# Patient Record
Sex: Female | Born: 2000 | Race: White | Hispanic: No | Marital: Single | Smoking: Never smoker
Health system: Southern US, Community
[De-identification: ages and names within clinical notes are randomized; demographics above are authoritative.]

## PROBLEM LIST (undated history)

## (undated) DIAGNOSIS — IMO0002 Reserved for concepts with insufficient information to code with codable children: Secondary | ICD-10-CM

## (undated) DIAGNOSIS — I10 Essential (primary) hypertension: Secondary | ICD-10-CM

## (undated) DIAGNOSIS — F329 Major depressive disorder, single episode, unspecified: Secondary | ICD-10-CM

## (undated) DIAGNOSIS — R41840 Attention and concentration deficit: Secondary | ICD-10-CM

## (undated) DIAGNOSIS — F32A Depression, unspecified: Secondary | ICD-10-CM

## (undated) DIAGNOSIS — F938 Other childhood emotional disorders: Secondary | ICD-10-CM

## (undated) DIAGNOSIS — T8859XA Other complications of anesthesia, initial encounter: Secondary | ICD-10-CM

## (undated) HISTORY — DX: Essential (primary) hypertension: I10

## (undated) HISTORY — DX: Other complications of anesthesia, initial encounter: T88.59XA

---
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
## 2021-01-18 ENCOUNTER — Emergency Department (HOSPITAL_COMMUNITY)
Admission: EM | Admit: 2021-01-18 | Discharge: 2021-01-18 | Disposition: A | Discharge status: Home / Self Care | Payer: Medicaid Other | Attending: Emergency Medicine | Admitting: Emergency Medicine

## 2021-01-18 ENCOUNTER — Encounter (HOSPITAL_COMMUNITY): Payer: Self-pay | Admitting: Emergency Medicine

## 2021-01-18 ENCOUNTER — Emergency Department (HOSPITAL_COMMUNITY): Payer: Medicaid Other

## 2021-01-18 ENCOUNTER — Other Ambulatory Visit: Payer: Self-pay

## 2021-01-18 DIAGNOSIS — Z7722 Contact with and (suspected) exposure to environmental tobacco smoke (acute) (chronic): Secondary | ICD-10-CM | POA: Diagnosis not present

## 2021-01-18 DIAGNOSIS — J45909 Unspecified asthma, uncomplicated: Secondary | ICD-10-CM | POA: Insufficient documentation

## 2021-01-18 DIAGNOSIS — R109 Unspecified abdominal pain: Secondary | ICD-10-CM | POA: Insufficient documentation

## 2021-01-18 DIAGNOSIS — N939 Abnormal uterine and vaginal bleeding, unspecified: Secondary | ICD-10-CM

## 2021-01-18 LAB — POC URINE PREG, ED: Preg Test, Ur: NEGATIVE

## 2021-01-18 NOTE — ED Provider Notes (Signed)
[facility]-EMERGENCY DEPT Provider Note   CSN: [id number] Arrival date & time: 01/18/21  0146     History Chief Complaint  Patient presents with  . Vaginal Bleeding    Rebecca Navarro is a 20 y.o. female.  The history is provided by the patient and medical records.  Vaginal Bleeding   20 y.o. F with hx of anxiety, asthma, ADHD, depression, presenting to the ED for heavy vaginal bleeding.  States she had a normal menstrual cycle March 15th.  States she started bleeding again 2 days ago-- initially spotting but now heavy.  Reports some abdominal cramping.  Denies discharge, dysuria, or difficulty urinating.  No fever/chills.  States cycles are usually regular.  Not currently on birth control.  No known hx of fibroids, cysts but reports mom has history of endometriosis and PCOS.  She denies any dizziness or lightheadedness, no feelings of syncope.  Past Medical History:  Diagnosis Date  . 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 Asthma   . Attention deficit   . Depression   . Self-harm 01/14/2016    Patient Active Problem List   Diagnosis Date Noted  . Chronic bilateral low back pain without sciatica 12/07/2016  . Acute pain of right knee 12/07/2016  . Degenerative disc disease, lumbar 08/26/2016  . 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 Self-harm 01/14/2016  . Severe episode of recurrent major depressive disorder, without psychotic features ([facility])     History reviewed. No pertinent surgical history.   OB History   No obstetric history on file.     History reviewed. No pertinent family history.  Social History   Tobacco Use  . Smoking status: Passive Smoke Exposure - Never Smoker  . Smokeless tobacco: Never Used    Home Medications Prior to Admission medications   Medication Sig Start Date End Date Taking? Authorizing Provider  busPIRone (BUSPAR) 5 MG tablet Take 1 tablet (5 mg total) by mouth 2 (two) times daily. 01/14/16    Thedora Hinders, MD  cetirizine (ZYRTEC) 10 MG tablet Take 10 mg by mouth daily.    [provider]  EPINEPHrine (EPI-PEN) 0.3 mg/0.3 mL SOAJ injection Inject 0.3 mLs (0.3 mg total) into the muscle once. 12/23/13   Marcellina Millin, MD  fluticasone (FLONASE) 50 MCG/ACT nasal spray Place 1 spray into both nostrils daily.    [provider]  Olopatadine HCl (PAZEO) 0.7 % SOLN Apply 1 drop to eye daily.    [provider]  venlafaxine XR (EFFEXOR-XR) 75 MG 24 hr capsule Take 1 capsule (75 mg total) by mouth daily with breakfast. 01/14/16   Thedora Hinders, MD    Allergies    Bee venom, Ibuprofen, Trichophyton, Diclofenac, and Nsaids  Review of Systems   Review of Systems  Genitourinary: Positive for vaginal bleeding.  All other systems reviewed and are negative.   Physical Exam Updated Vital Signs BP 134/78 (BP Location: Left Wrist)   Pulse 80   Temp 98.4 F (36.9 C) (Oral)   Resp 18   Ht 5\' 5"  (1.651 m)   Wt 93 kg   LMP 01/04/2021   SpO2 98%   BMI 34.11 kg/m   Physical Exam Vitals and nursing note reviewed.  Constitutional:      Appearance: She is well-developed.  HENT:     Head: Normocephalic and atraumatic.  Eyes:     Conjunctiva/sclera: Conjunctivae normal.     Pupils: Pupils are equal, round, and reactive to light.  Cardiovascular:     Rate and Rhythm: Normal rate and regular rhythm.     Heart sounds: Normal heart sounds.  Pulmonary:     Effort: Pulmonary effort is normal. No respiratory distress.     Breath sounds: Normal breath sounds. No rhonchi.  Abdominal:     General: Bowel sounds are normal.     Palpations: Abdomen is soft.     Tenderness: There is no abdominal tenderness. There is no rebound.  Musculoskeletal:        General: Normal range of motion.     Cervical back: Normal range of motion.  Skin:    General: Skin is warm and dry.  Neurological:     Mental Status: She is alert and oriented to person,  place, and time.     ED Results / Procedures / Treatments   Labs (all labs ordered are listed, but only abnormal results are displayed) Labs Reviewed  POC URINE PREG, ED    EKG None  Radiology No results found.  Procedures Procedures   Medications Ordered in ED Medications - No data to display  ED Course  I have reviewed the triage vital signs and the nursing notes.  Pertinent labs & imaging results that were available during my care of the patient were reviewed by me and considered in my medical decision making (see chart for details).    MDM Rules/Calculators/A&P  20 year old female presenting to the ED with vaginal bleeding.  States normal menstrual cycle about 2 weeks ago but began having heavy bleeding 2 days ago.  She is not currently on any type of birth control.  States her cycles are usually fairly regular.  Mother has history of endometriosis and PCOS.  She is afebrile and nontoxic in appearance here.  Abdomen is soft and nontender.  Pregnancy test is negative.  Will obtain ultrasound as she has never had any formal imaging.  Of note, she was evaluated at MAU last December for similar episode but never followed-up with GYN.  Pregnancy negative.  US obtained, no acute findings.  Patient remains HD stable without any dizziness or lightheadedness.  Feel she is stable for discharge home.  I encouraged close follow-up with OB/GYN for ongoing management.  She may return here for any new or acute changes.  Final Clinical Impression(s) / ED Diagnoses Final diagnoses:  Vaginal bleeding    Rx / DC Orders ED Discharge Orders    None       Garlon Hatchet, PA-C 01/18/21 0421    Molpus, Jonny Ruiz, MD 01/18/21 [phone]

## 2021-01-18 NOTE — Discharge Instructions (Signed)
Your ultrasound today was normal. You will need to follow-up with an OB-GYN for further evaluation and management.

## 2021-01-18 NOTE — ED Triage Notes (Signed)
Patient complaining of heavy vaginal bleeding that started two days ago. Patient she states she had a period two weeks ago.

## 2021-02-14 ENCOUNTER — Encounter (HOSPITAL_COMMUNITY): Payer: Self-pay

## 2021-02-14 ENCOUNTER — Other Ambulatory Visit: Payer: Self-pay

## 2021-02-14 ENCOUNTER — Emergency Department (HOSPITAL_COMMUNITY)
Admission: EM | Admit: 2021-02-14 | Discharge: 2021-02-14 | Disposition: A | Discharge status: Home / Self Care | Payer: Medicaid Other | Attending: Emergency Medicine | Admitting: Emergency Medicine

## 2021-02-14 DIAGNOSIS — J45909 Unspecified asthma, uncomplicated: Secondary | ICD-10-CM | POA: Diagnosis not present

## 2021-02-14 DIAGNOSIS — N939 Abnormal uterine and vaginal bleeding, unspecified: Secondary | ICD-10-CM | POA: Diagnosis not present

## 2021-02-14 DIAGNOSIS — Z7722 Contact with and (suspected) exposure to environmental tobacco smoke (acute) (chronic): Secondary | ICD-10-CM | POA: Diagnosis not present

## 2021-02-14 DIAGNOSIS — Z7951 Long term (current) use of inhaled steroids: Secondary | ICD-10-CM | POA: Diagnosis not present

## 2021-02-14 LAB — WET PREP, GENITAL
Clue Cells Wet Prep HPF POC: NONE SEEN
Sperm: NONE SEEN
Trich, Wet Prep: NONE SEEN
Yeast Wet Prep HPF POC: NONE SEEN

## 2021-02-14 LAB — CBC
HCT: 41.9 % (ref 36.0–46.0)
Hemoglobin: 13.5 g/dL (ref 12.0–15.0)
MCH: 29.2 pg (ref 26.0–34.0)
MCHC: 32.2 g/dL (ref 30.0–36.0)
MCV: 90.7 fL (ref 80.0–100.0)
Platelets: 319 10*3/uL (ref 150–400)
RBC: 4.62 MIL/uL (ref 3.87–5.11)
RDW: 12.5 % (ref 11.5–15.5)
WBC: 10.3 10*3/uL (ref 4.0–10.5)
nRBC: 0 % (ref 0.0–0.2)

## 2021-02-14 LAB — HCG, QUANTITATIVE, PREGNANCY: hCG, Beta Chain, Quant, S: <1 m[IU]/mL (ref <5)

## 2021-02-14 NOTE — ED Triage Notes (Signed)
Pt reports 2 positive home pregnancy tests on the 27th. Approximately 1 hour prior to arrival pt sts sudden vaginal bleeding, placed a tampon and presented to ER. Denies pain or obvious blood clots.

## 2021-02-14 NOTE — Discharge Instructions (Addendum)
Your pregnancy test is negative in the ER today.  Please follow up with obgyn within 1 week.  Return to the ER for new or worsening symptoms including but not limited to fever, abdominal pain, dizziness, passing out, vomiting, heavy bleeding, or any other concerns.

## 2021-02-14 NOTE — ED Provider Notes (Signed)
[facility]-EMERGENCY DEPT Provider Note   CSN: [id number] Arrival date & time: 02/14/21  0419     History Chief Complaint  Patient presents with  . Vaginal Bleeding    Rebecca Navarro is a 20 y.o. female who presents to the emergency department with concern for positive pregnancy test at home with vaginal bleeding.  Patient's last menstrual period was 01/15/2021, had positive pregnancy test at home, has had some spotting over the past few days and then had more bleeding tonight.  She is not having any pain.  No alleviating or aggravating factors or symptoms.  She denies fever, emesis, pelvic pain, abdominal pain, diarrhea, dysuria, or atypical vaginal discharge.  HPI     Past Medical History:  Diagnosis Date  . 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 Asthma   . Attention deficit   . Depression   . Self-harm 01/14/2016    Patient Active Problem List   Diagnosis Date Noted  . Chronic bilateral low back pain without sciatica 12/07/2016  . Acute pain of right knee 12/07/2016  . Degenerative disc disease, lumbar 08/26/2016  . 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 Self-harm 01/14/2016  . Severe episode of recurrent major depressive disorder, without psychotic features (HCC)     History reviewed. No pertinent surgical history.   OB History   No obstetric history on file.     No family history on file.  Social History   Tobacco Use  . Smoking status: Passive Smoke Exposure - Never Smoker  . Smokeless tobacco: Never Used    Home Medications Prior to Admission medications   Medication Sig Start Date End Date Taking? Authorizing Provider  busPIRone (BUSPAR) 5 MG tablet Take 1 tablet (5 mg total) by mouth 2 (two) times daily. 01/14/16   Thedora Hinders, MD  cetirizine (ZYRTEC) 10 MG tablet Take 10 mg by mouth daily.    [provider]  EPINEPHrine (EPI-PEN) 0.3 mg/0.3 mL SOAJ injection Inject 0.3 mLs (0.3 mg  total) into the muscle once. 12/23/13   Marcellina Millin, MD  fluticasone (FLONASE) 50 MCG/ACT nasal spray Place 1 spray into both nostrils daily.    [provider]  Olopatadine HCl (PAZEO) 0.7 % SOLN Apply 1 drop to eye daily.    [provider]  venlafaxine XR (EFFEXOR-XR) 75 MG 24 hr capsule Take 1 capsule (75 mg total) by mouth daily with breakfast. 01/14/16   Amada Kingfisher, Pieter Partridge, MD    Allergies    Bee venom, Ibuprofen, Trichophyton, Diclofenac, and Nsaids  Review of Systems   Review of Systems  Constitutional: Negative for chills and fever.  Respiratory: Negative for shortness of breath.   Cardiovascular: Negative for chest pain.  Gastrointestinal: Negative for abdominal pain and vomiting.  Genitourinary: Positive for vaginal bleeding. Negative for dysuria, pelvic pain and vaginal discharge.  Neurological: Negative for syncope.  All other systems reviewed and are negative.   Physical Exam Updated Vital Signs BP 116/61   Pulse 68   Temp 98.3 F (36.8 C) (Oral)   Resp 17   Ht 5\' 5"  (1.651 m)   Wt 88.5 kg   SpO2 99%   BMI 32.45 kg/m   Physical Exam Vitals and nursing note reviewed.  Constitutional:      General: She is not in acute distress.    Appearance: She is well-developed. She is not toxic-appearing.  HENT:     Head: Normocephalic and atraumatic.  Eyes:     General:  Right eye: No discharge.        Left eye: No discharge.     Conjunctiva/sclera: Conjunctivae normal.  Cardiovascular:     Rate and Rhythm: Normal rate and regular rhythm.  Pulmonary:     Effort: Pulmonary effort is normal. No respiratory distress.     Breath sounds: Normal breath sounds. No wheezing, rhonchi or rales.  Abdominal:     General: There is no distension.     Palpations: Abdomen is soft.     Tenderness: There is no abdominal tenderness. There is no guarding or rebound.  Genitourinary:    Comments: RN present as chaperone.  No external lesions.   Speculum exam with no significant discharge.  No active bleeding.  Very minimal amount of blood present in the vaginal vault.  No adnexal or cervical motion tenderness  Musculoskeletal:     Cervical back: Neck supple.  Skin:    General: Skin is warm and dry.     Findings: No rash.  Neurological:     Mental Status: She is alert.     Comments: Clear speech.   Psychiatric:        Behavior: Behavior normal.     ED Results / Procedures / Treatments   Labs (all labs ordered are listed, but only abnormal results are displayed) Labs Reviewed  WET PREP, GENITAL - Abnormal; Notable for the following components:      Result Value   WBC, Wet Prep HPF POC MANY (*)    All other components within normal limits  CBC  HCG, QUANTITATIVE, PREGNANCY  GC/CHLAMYDIA PROBE AMP ([place]) NOT AT [facility]    EKG None  Radiology No results found.  Procedures Procedures   Medications Ordered in ED Medications - No data to display  ED Course  I have reviewed the triage vital signs and the nursing notes.  Pertinent labs & imaging results that were available during my care of the patient were reviewed by me and considered in my medical decision making (see chart for details).    MDM Rules/Calculators/A&P                         Patient presents to the ED with complaints of vaginal bleeding with positive pregnancy test at home.  Nontoxic, resting comfortably, vitals are within normal limits.  Additional history obtained:  Additional history obtained from chart review & nursing note review.   Lab Tests:  I Ordered, reviewed, and interpreted labs, which included:  Pregnancy test: Negative CBC: Unremarkable Wet prep: Many WBCs, no yeast, trichomoniasis, or findings to suggest BV.  ED Course:  Pregnancy test negative.  This may be patient's menstrual cycle beginning.  She has no abdominal pain, abdominal tenderness, or adnexal/cervical motion tenderness to raise concern for ovarian torsion or  PID.  Overall appears appropriate for discharge home. I discussed results, treatment plan, need for follow-up, and return precautions with the patient. Provided opportunity for questions, patient confirmed understanding and is in agreement with plan.   Portions of this note were generated with Scientist, clinical (histocompatibility and immunogenetics). Dictation errors may occur despite best attempts at proofreading.  Final Clinical Impression(s) / ED Diagnoses Final diagnoses:  Vaginal bleeding    Rx / DC Orders ED Discharge Orders    None       Cherly Anderson, PA-C 02/14/21 Sarita Haver, MD 02/14/21 [phone]

## 2021-02-17 LAB — GC/CHLAMYDIA PROBE AMP (~~LOC~~) NOT AT ARMC
Chlamydia: POSITIVE — AB
Comment: NEGATIVE
Comment: NORMAL
Neisseria Gonorrhea: NEGATIVE

## 2021-02-19 ENCOUNTER — Other Ambulatory Visit: Payer: Self-pay | Admitting: Advanced Practice Midwife

## 2021-02-19 MED ORDER — AZITHROMYCIN 500 MG PO TABS: 1000.0000 mg | ORAL_TABLET | Freq: Once | ORAL | 0 refills | Status: AC

## 2021-07-19 ENCOUNTER — Emergency Department (HOSPITAL_COMMUNITY): Payer: Medicaid Other

## 2021-07-19 ENCOUNTER — Encounter (HOSPITAL_COMMUNITY): Payer: Self-pay | Admitting: Emergency Medicine

## 2021-07-19 ENCOUNTER — Other Ambulatory Visit: Payer: Self-pay

## 2021-07-19 ENCOUNTER — Emergency Department (HOSPITAL_COMMUNITY)
Admission: EM | Admit: 2021-07-19 | Discharge: 2021-07-19 | Disposition: A | Discharge status: Home / Self Care | Payer: Medicaid Other | Attending: Emergency Medicine | Admitting: Emergency Medicine

## 2021-07-19 DIAGNOSIS — R1011 Right upper quadrant pain: Secondary | ICD-10-CM | POA: Insufficient documentation

## 2021-07-19 DIAGNOSIS — J45909 Unspecified asthma, uncomplicated: Secondary | ICD-10-CM | POA: Insufficient documentation

## 2021-07-19 DIAGNOSIS — Z7722 Contact with and (suspected) exposure to environmental tobacco smoke (acute) (chronic): Secondary | ICD-10-CM | POA: Diagnosis not present

## 2021-07-19 DIAGNOSIS — K8020 Calculus of gallbladder without cholecystitis without obstruction: Secondary | ICD-10-CM

## 2021-07-19 DIAGNOSIS — R1013 Epigastric pain: Secondary | ICD-10-CM | POA: Diagnosis not present

## 2021-07-19 DIAGNOSIS — Z7951 Long term (current) use of inhaled steroids: Secondary | ICD-10-CM | POA: Diagnosis not present

## 2021-07-19 DIAGNOSIS — R1110 Vomiting, unspecified: Secondary | ICD-10-CM | POA: Diagnosis not present

## 2021-07-19 DIAGNOSIS — U071 COVID-19: Secondary | ICD-10-CM

## 2021-07-19 HISTORY — DX: COVID-19: U07.1

## 2021-07-19 LAB — CBC WITH DIFFERENTIAL/PLATELET
Abs Immature Granulocytes: 0.05 10*3/uL (ref 0.00–0.07)
Basophils Absolute: 0.1 10*3/uL (ref 0.0–0.1)
Basophils Relative: 0 %
Eosinophils Absolute: 0.1 10*3/uL (ref 0.0–0.5)
Eosinophils Relative: 0 %
HCT: 45.6 % (ref 36.0–46.0)
Hemoglobin: 14.6 g/dL (ref 12.0–15.0)
Immature Granulocytes: 0 %
Lymphocytes Relative: 18 %
Lymphs Abs: 2 10*3/uL (ref 0.7–4.0)
MCH: 29 pg (ref 26.0–34.0)
MCHC: 32 g/dL (ref 30.0–36.0)
MCV: 90.7 fL (ref 80.0–100.0)
Monocytes Absolute: 0.7 10*3/uL (ref 0.1–1.0)
Monocytes Relative: 6 %
Neutro Abs: 8.6 10*3/uL — ABNORMAL HIGH (ref 1.7–7.7)
Neutrophils Relative %: 76 %
Platelets: 318 10*3/uL (ref 150–400)
RBC: 5.03 MIL/uL (ref 3.87–5.11)
RDW: 12.2 % (ref 11.5–15.5)
WBC: 11.5 10*3/uL — ABNORMAL HIGH (ref 4.0–10.5)
nRBC: 0 % (ref 0.0–0.2)

## 2021-07-19 LAB — COMPREHENSIVE METABOLIC PANEL
ALT: 27 U/L (ref 0–44)
AST: 35 U/L (ref 15–41)
Albumin: 4.3 g/dL (ref 3.5–5.0)
Alkaline Phosphatase: 71 U/L (ref 38–126)
Anion gap: 7 (ref 5–15)
BUN: 11 mg/dL (ref 6–20)
CO2: 24 mmol/L (ref 22–32)
Calcium: 9.3 mg/dL (ref 8.9–10.3)
Chloride: 108 mmol/L (ref 98–111)
Creatinine, Ser: 0.93 mg/dL (ref 0.44–1.00)
GFR, Estimated: >60 mL/min (ref >60)
Glucose, Bld: 94 mg/dL (ref 70–99)
Potassium: 4.2 mmol/L (ref 3.5–5.1)
Sodium: 139 mmol/L (ref 135–145)
Total Bilirubin: 0.5 mg/dL (ref 0.3–1.2)
Total Protein: 7.3 g/dL (ref 6.5–8.1)

## 2021-07-19 LAB — I-STAT BETA HCG BLOOD, ED (MC, WL, AP ONLY): I-stat hCG, quantitative: <5 m[IU]/mL (ref <5)

## 2021-07-19 LAB — LIPASE, BLOOD: Lipase: 34 U/L (ref 11–51)

## 2021-07-19 MED ORDER — LIDOCAINE VISCOUS HCL 2 % MT SOLN
15.0000 mL | Freq: Once | OROMUCOSAL | Status: AC
  Administered 2021-07-19: 15 mL via ORAL
  Filled 2021-07-19: qty 15

## 2021-07-19 MED ORDER — ALUM & MAG HYDROXIDE-SIMETH 200-200-20 MG/5ML PO SUSP
30.0000 mL | Freq: Once | ORAL | Status: AC
  Administered 2021-07-19: 30 mL via ORAL
  Filled 2021-07-19: qty 30

## 2021-07-19 MED ORDER — OXYCODONE-ACETAMINOPHEN 5-325 MG PO TABS
2.0000 | ORAL_TABLET | Freq: Once | ORAL | Status: AC
  Administered 2021-07-19: 2 via ORAL
  Filled 2021-07-19: qty 2

## 2021-07-19 MED ORDER — OXYCODONE HCL 5 MG PO TABS: 5.0000 mg | ORAL_TABLET | Freq: Four times a day (QID) | ORAL | 0 refills | Status: DC | PRN

## 2021-07-19 MED ORDER — ONDANSETRON 4 MG PO TBDP
4.0000 mg | ORAL_TABLET | Freq: Once | ORAL | Status: AC
  Administered 2021-07-19: 4 mg via ORAL
  Filled 2021-07-19: qty 1

## 2021-07-19 MED ORDER — ONDANSETRON 4 MG PO TBDP: 4.0000 mg | ORAL_TABLET | Freq: Three times a day (TID) | ORAL | 0 refills | Status: DC | PRN

## 2021-07-19 MED ORDER — FAMOTIDINE 20 MG PO TABS
40.0000 mg | ORAL_TABLET | Freq: Once | ORAL | Status: AC
  Administered 2021-07-19: 40 mg via ORAL
  Filled 2021-07-19: qty 2

## 2021-07-19 NOTE — ED Notes (Signed)
Pt transported to US

## 2021-07-19 NOTE — ED Provider Notes (Signed)
[facility] EMERGENCY DEPARTMENT Provider Note   CSN: [id number] Arrival date & time: 07/19/21  1128     History Chief Complaint  Patient presents with   Abdominal Pain    Rebecca Navarro is a 20 y.o. female.  Patient with no past surgical history presents to the emergency department for evaluation of upper abdominal pain that started acutely this morning.  Patient tells me it started about 2 hours ago.  No associated fevers.  She did vomit but does not feel particularly nauseous.  It is a burning sensation that radiates up into her mid chest and she has a sharp sensation in her epigastrium.  She denies heavy NSAID or alcohol use.  Does use marijuana.  No associated urinary symptoms, vaginal bleeding or discharge.  She has had intermittent episodes of pain like this in the past.  She has never had an endoscopy.  She took Tums this morning prior to arrival without much improvement.  Pain does not radiate to her back or shoulders.  No chest pain or shortness of breath. The onset of this condition was acute. The course is constant. Aggravating factors: none. Alleviating factors: none.        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 No pertinent surgical history.   OB History   No obstetric history on file.     No family history on file.  Social History   Tobacco Use   Smoking status: Never    Passive exposure: Yes   Smokeless tobacco: Never  Vaping Use    Vaping Use: Every day  Substance Use Topics   Alcohol use: Not Currently   Drug use: Yes    Types: Marijuana    Home Medications Prior to Admission medications   Medication Sig Start Date End Date Taking? Authorizing Provider  busPIRone (BUSPAR) 5 MG tablet Take 1 tablet (5 mg total) by mouth 2 (two) times daily. 01/14/16   Thedora Hinders, MD  cetirizine (ZYRTEC) 10 MG tablet Take 10 mg by mouth daily.    [provider]  EPINEPHrine (EPI-PEN) 0.3 mg/0.3 mL SOAJ injection Inject 0.3 mLs (0.3 mg total) into the muscle once. 12/23/13   Marcellina Millin, MD  fluticasone (FLONASE) 50 MCG/ACT nasal spray Place 1 spray into both nostrils daily.    [provider]  Olopatadine HCl (PAZEO) 0.7 % SOLN Apply 1 drop to eye daily.    [provider]  venlafaxine XR (EFFEXOR-XR) 75 MG 24 hr capsule Take 1 capsule (75 mg total) by mouth daily with breakfast. 01/14/16   Thedora Hinders, MD    Allergies    Bee venom, Ibuprofen, Trichophyton, Diclofenac, and Nsaids  Review of Systems   Review of Systems  Constitutional:  Negative for fever.  HENT:  Negative for rhinorrhea and sore throat.   Eyes:  Negative for redness.  Respiratory:  Negative for cough.   Cardiovascular:  Negative for chest pain.  Gastrointestinal:  Positive for  abdominal pain and vomiting. Negative for diarrhea and nausea.  Genitourinary:  Negative for dysuria, frequency, hematuria and urgency.  Musculoskeletal:  Negative for myalgias.  Skin:  Negative for rash.  Neurological:  Negative for headaches.   Physical Exam Updated Vital Signs BP (!) 126/91 (BP Location: Left Arm)   Pulse 75   Temp 98 F (36.7 C) (Oral)   Resp 15   Ht 5\' 5"  (1.651 m)   Wt 95.3 kg   LMP 06/19/2021   SpO2 100%   BMI 34.95 kg/m   Physical Exam Vitals and nursing note reviewed.  Constitutional:      General: She is not in acute distress.    Appearance: She is well-developed.  HENT:     Head:  Normocephalic and atraumatic.     Right Ear: External ear normal.     Left Ear: External ear normal.     Nose: Nose normal.  Eyes:     Conjunctiva/sclera: Conjunctivae normal.  Cardiovascular:     Rate and Rhythm: Normal rate and regular rhythm.     Heart sounds: No murmur heard. Pulmonary:     Effort: No respiratory distress.     Breath sounds: No wheezing, rhonchi or rales.  Abdominal:     Palpations: Abdomen is soft.     Tenderness: There is abdominal tenderness (mild) in the right upper quadrant and epigastric area. There is no guarding or rebound.  Musculoskeletal:     Cervical back: Normal range of motion and neck supple.     Right lower leg: No edema.     Left lower leg: No edema.  Skin:    General: Skin is warm and dry.     Findings: No rash.  Neurological:     General: No focal deficit present.     Mental Status: She is alert. Mental status is at baseline.     Motor: No weakness.  Psychiatric:        Mood and Affect: Mood normal.    ED Results / Procedures / Treatments   Labs (all labs ordered are listed, but only abnormal results are displayed) Labs Reviewed  CBC WITH DIFFERENTIAL/PLATELET - Abnormal; Notable for the following components:      Result Value   WBC 11.5 (*)    Neutro Abs 8.6 (*)    All other components within normal limits  LIPASE, BLOOD  COMPREHENSIVE METABOLIC PANEL  I-STAT BETA HCG BLOOD, ED ([facility], [facility], [facility] ONLY)    EKG None  Radiology 08/19/2021 Abdomen Limited RUQ (LIVER/GB)  Result Date: 07/19/2021 CLINICAL DATA:  Epigastric pain. EXAM: ULTRASOUND ABDOMEN LIMITED RIGHT UPPER QUADRANT COMPARISON:  None. FINDINGS: Gallbladder: Cholelithiasis without wall thickening, pericholecystic fluid, sludge, or Murphy's sign. Common bile duct: Diameter: 8.7 mm Liver: No focal lesion identified. Within normal limits in parenchymal echogenicity. Portal vein is patent on color Doppler imaging with normal direction of blood flow towards the liver. Other: None.  IMPRESSION: 1. Cholelithiasis without wall thickening, pericholecystic fluid, or Murphy's sign. 2. Dilated common bile duct raising the possibility of choledocholithiasis. Recommend MRCP or ERCP for further evaluation. Electronically Signed   By: 09/18/2021 III M.D.   On: 07/19/2021 13:11    Procedures Procedures   Medications Ordered in ED Medications  ondansetron (ZOFRAN-ODT) disintegrating tablet 4 mg (4 mg Oral Given 07/19/21 1350)  alum & mag hydroxide-simeth (MAALOX/MYLANTA) 200-200-20 MG/5ML suspension 30 mL (30 mLs Oral Given 07/19/21 1350)    And  lidocaine (XYLOCAINE) 2 % viscous mouth solution 15  mL (15 mLs Oral Given 07/19/21 1350)  famotidine (PEPCID) tablet 40 mg (40 mg Oral Given 07/19/21 1348)    ED Course  I have reviewed the triage vital signs and the nursing notes.  Pertinent labs & imaging results that were available during my care of the patient were reviewed by me and considered in my medical decision making (see chart for details).  Patient seen and examined. Work-up initiated in triage.  Added pregnancy test, GI cocktail and Pepcid.  Will obtain right upper quadrant ultrasound.  Vital signs reviewed and are as follows: BP (!) 126/91 (BP Location: Left Arm)   Pulse 75   Temp 98 F (36.7 C) (Oral)   Resp 15   Ht 5\' 5"  (1.651 m)   Wt 95.3 kg   LMP 06/19/2021   SpO2 100%   BMI 34.95 kg/m   1:59 PM patient has just now received medications as there was another sick patient in the department requiring resources.  I updated the patient on her ultrasound result.  She does not appear to be in any distress.  She states that the GI cocktail numbed her throat.  Anticipate discharge to home with small amount of pain medication and nausea medication to use for symptom control.  We discussed avoiding foods, especially greasy and fatty foods, that seem to make her pain worse.  Encouraged surgery follow-up, referral given.  Encouraged return to the emergency department  with persistent pain, especially pain that lasts longer than typical attacks, persistent vomiting, yellow coloring of the skin or fever.    MDM Rules/Calculators/A&P                            Patient with second attack of epigastric and right upper quadrant pain of the week.  Symptoms are improving.  She was evaluated for gallstones.  Right upper quadrant ultrasound demonstrates gallstones with 8 millimeter common bile duct.  Patient does not have elevated LFTs, bilirubin or lipase to suggest choledocholithiasis at this time and clinically she does not show signs of obstruction.  As her symptoms are not persistent and she does not have a fever, feel that she can go home with trial of symptom control.  She will need close surgery follow-up given findings above.  We discussed signs and symptoms to return as above.   Final Clinical Impression(s) / ED Diagnoses Final diagnoses:  Epigastric pain    Rx / DC Orders ED Discharge Orders          Ordered    oxyCODONE (OXY IR/ROXICODONE) 5 MG immediate release tablet  Every 6 hours PRN        07/19/21 1436    ondansetron (ZOFRAN ODT) 4 MG disintegrating tablet  Every 8 hours PRN        07/19/21 1436             09/18/21, PA-C 07/19/21 1513    09/18/21, MD 07/19/21 1645

## 2021-07-19 NOTE — ED Triage Notes (Signed)
Pt reports epigastric pain x 2 hours.  Vomited x 2.  Took 3 TUMS without relief.  States she has been seen in ED before for same and symptoms resolved after medication but she is unsure of diagnosis.

## 2021-07-19 NOTE — ED Provider Notes (Signed)
Emergency Medicine Provider Triage Evaluation Note  Rebecca Navarro , a 20 y.o. female  was evaluated in triage.  Pt complains of epigastric abdominal pain since this morning that radiates in the chest. Characterized as a stabbing sensation. It woke her up from sleep at 2am. Has had one other episode similar in the past. Does admit to daily heavy marijuana use. No fever, chills, diarrhea, cough, congestion, or shortness of breath. No blood in the vomiting.   Review of Systems  Positive:  Negative: See above  Physical Exam  BP (!) 126/91 (BP Location: Left Arm)   Pulse 75   Temp 98 F (36.7 C) (Oral)   Resp 15   Ht 5\' 5"  (1.651 m)   Wt 88 kg   LMP 06/19/2021   SpO2 100%   BMI 32.28 kg/m  Gen:   Awake, no distress   Resp:  Normal effort  MSK:   Moves extremities without difficulty  Other:  Moderate epigastric tenderness  Medical Decision Making  Medically screening exam initiated at 11:48 AM.  Appropriate orders placed.  Sonal Sumedha Munnerlyn was informed that the remainder of the evaluation will be completed by another provider, this initial triage assessment does not replace that evaluation, and the importance of remaining in the ED until their evaluation is complete.     Carola [place], PA-C 07/19/21 1151    09/18/21, MD 07/20/21 1346

## 2021-07-19 NOTE — Discharge Instructions (Signed)
Please read and follow all provided instructions.  Your diagnoses today include:  1. Epigastric pain   2. Calculus of gallbladder without cholecystitis without obstruction     Tests performed today include: Blood cell counts and platelets - slightly high white blood cell count Kidney and liver function tests Pancreas function test (called lipase) Urine test to look for infection A blood or urine test for pregnancy (women only) Ultrasound - shows gallstones without signs of gallbladder infection, however the tube that connects the gallbladder to the intestine is dilated Vital signs. See below for your results today.   Medications prescribed:  Percocet (oxycodone/acetaminophen) - narcotic pain medication  DO NOT drive or perform any activities that require you to be awake and alert because this medicine can make you drowsy. BE VERY CAREFUL not to take multiple medicines containing Tylenol (also called acetaminophen). Doing so can lead to an overdose which can damage your liver and cause liver failure and possibly death.  Zofran (ondansetron) - for nausea and vomiting  Take any prescribed medications only as directed.  Home care instructions:  Follow any educational materials contained in this packet.  Follow-up instructions: Please follow-up with your primary care provider or the surgeon listed in the next 5-7 days for further evaluation of your symptoms.    Return instructions:  SEEK IMMEDIATE MEDICAL ATTENTION IF: The pain does not go away or becomes severe  A temperature above 101F develops  Repeated vomiting occurs (multiple episodes)  The pain becomes localized to portions of the abdomen. The right side could possibly be appendicitis. In an adult, the left lower portion of the abdomen could be colitis or diverticulitis.  Blood is being passed in stools or vomit (bright red or black tarry stools)  You develop chest pain, difficulty breathing, dizziness or fainting, or become  confused, poorly responsive, or inconsolable (young children) If you have any other emergent concerns regarding your health  Additional Information: Abdominal (belly) pain can be caused by many things. Your caregiver performed an examination and possibly ordered blood/urine tests and imaging (CT scan, x-rays, ultrasound). Many cases can be observed and treated at home after initial evaluation in the emergency department. Even though you are being discharged home, abdominal pain can be unpredictable. Therefore, you need a repeated exam if your pain does not resolve, returns, or worsens. Most patients with abdominal pain don't have to be admitted to the hospital or have surgery, but serious problems like appendicitis and gallbladder attacks can start out as nonspecific pain. Many abdominal conditions cannot be diagnosed in one visit, so follow-up evaluations are very important.  Your vital signs today were: BP (!) 126/91 (BP Location: Left Arm)   Pulse 75   Temp 98 F (36.7 C) (Oral)   Resp 15   Ht 5\' 5"  (1.651 m)   Wt 95.3 kg   LMP 06/19/2021   SpO2 100%   BMI 34.95 kg/m  If your blood pressure (bp) was elevated above 135/85 this visit, please have this repeated by your doctor within one month. --------------

## 2021-07-28 ENCOUNTER — Other Ambulatory Visit: Payer: Self-pay | Admitting: Surgery

## 2021-08-19 HISTORY — PX: CHOLECYSTECTOMY: SHX55

## 2021-10-19 NOTE — L&D Delivery Note (Addendum)
[facility]N [facility] Delivery Note  Rebecca Navarro is a 21 y.o. G1P0 s/p SVD at [redacted]w[redacted]d. She was admitted for SOL.   ROM: 6h 78m with clear fluid GBS Status: neg Maximum Maternal Temperature: 98.8  Labor Progress: Progressed well with pitocin augmentation  Delivery Date/Time: 07/28/22 1641 Delivery: Called to room and patient was complete and pushing. Head delivered OA. No nuchal cord present. Shoulder and body unable to be delivered with gentle head traction, CNM assisted with delivery of posterior shoulder and infant was delivered without complication or delay. Large gush of blood following delivery of head. Infant with spontaneous cry, placed on mother's abdomen, dried and stimulated. Cord clamped x 2 after 1-minute delay, and cut by FOB under my direct supervision. Cord blood drawn. Placenta delivered very quickly and spontaneously with gentle cord traction. Trailing membranes noted, gently removed with ringed forceps. Fundus firm with massage and Pitocin. Labia, perineum, vagina, and cervix were inspected, bleeding posterior first degree laceration repaired with figure of 8 suture, good hemostasis.   Placenta: complete, three vessel cord appreciated Complications: none Lacerations: see above. Shallow labial tears noted bilaterally, right side repaired in running subcuticular fashion. Left was well approximated and left to heal by secondary intention.  EBL: 750 mL Analgesia: epidural  Postpartum Planning [ ]  message to sent to schedule follow-up- pending  Infant: female  APGARs 8/9  pending weight  Cecilio Asper, [place] for Rebecca Navarro, Rebecca Navarro

## 2021-12-22 ENCOUNTER — Inpatient Hospital Stay (HOSPITAL_COMMUNITY)
Admission: AD | Admit: 2021-12-22 | Discharge: 2021-12-22 | Disposition: A | Discharge status: Home / Self Care | Payer: Medicaid Other | Attending: Family Medicine | Admitting: Family Medicine

## 2021-12-22 ENCOUNTER — Encounter (HOSPITAL_COMMUNITY): Payer: Self-pay | Admitting: Family Medicine

## 2021-12-22 ENCOUNTER — Encounter: Payer: Self-pay | Admitting: *Deleted

## 2021-12-22 ENCOUNTER — Other Ambulatory Visit: Payer: Self-pay

## 2021-12-22 DIAGNOSIS — R197 Diarrhea, unspecified: Secondary | ICD-10-CM | POA: Diagnosis not present

## 2021-12-22 DIAGNOSIS — O26891 Other specified pregnancy related conditions, first trimester: Secondary | ICD-10-CM | POA: Insufficient documentation

## 2021-12-22 DIAGNOSIS — O219 Vomiting of pregnancy, unspecified: Secondary | ICD-10-CM | POA: Insufficient documentation

## 2021-12-22 DIAGNOSIS — Z8616 Personal history of COVID-19: Secondary | ICD-10-CM | POA: Diagnosis not present

## 2021-12-22 DIAGNOSIS — Z3A08 8 weeks gestation of pregnancy: Secondary | ICD-10-CM | POA: Insufficient documentation

## 2021-12-22 LAB — URINALYSIS, ROUTINE W REFLEX MICROSCOPIC
Bilirubin Urine: NEGATIVE
Glucose, UA: NEGATIVE mg/dL
Hgb urine dipstick: NEGATIVE
Ketones, ur: 80 mg/dL — AB
Nitrite: NEGATIVE
Protein, ur: NEGATIVE mg/dL
Specific Gravity, Urine: 1.026 (ref 1.005–1.030)
pH: 5 (ref 5.0–8.0)

## 2021-12-22 LAB — POC URINE PREG, ED: Preg Test, Ur: POSITIVE — AB

## 2021-12-22 MED ORDER — METOCLOPRAMIDE HCL 10 MG PO TABS: 10.0000 mg | ORAL_TABLET | Freq: Three times a day (TID) | ORAL | 1 refills | Status: DC | PRN

## 2021-12-22 MED ORDER — LACTATED RINGERS IV BOLUS
1000.0000 mL | Freq: Once | INTRAVENOUS | Status: AC
  Administered 2021-12-22: 1000 mL via INTRAVENOUS

## 2021-12-22 MED ORDER — SCOPOLAMINE 1 MG/3DAYS TD PT72: 1.0000 | MEDICATED_PATCH | TRANSDERMAL | 0 refills | Status: DC

## 2021-12-22 MED ORDER — SCOPOLAMINE 1 MG/3DAYS TD PT72
1.0000 | MEDICATED_PATCH | Freq: Once | TRANSDERMAL | Status: DC
  Administered 2021-12-22: 1.5 mg via TRANSDERMAL
  Filled 2021-12-22: qty 1

## 2021-12-22 MED ORDER — METOCLOPRAMIDE HCL 5 MG/ML IJ SOLN
10.0000 mg | Freq: Once | INTRAMUSCULAR | Status: AC
  Administered 2021-12-22: 10 mg via INTRAVENOUS
  Filled 2021-12-22: qty 2

## 2021-12-22 MED ORDER — FAMOTIDINE IN NACL 20-0.9 MG/50ML-% IV SOLN
20.0000 mg | Freq: Once | INTRAVENOUS | Status: AC
  Administered 2021-12-22: 20 mg via INTRAVENOUS
  Filled 2021-12-22: qty 50

## 2021-12-22 NOTE — MAU Provider Note (Signed)
?History  ?  ? ?[id number] ? ?Arrival date and time: 12/22/21 1648 ?  ? ?Chief Complaint  ?Patient presents with  ? Emesis  ? Nausea  ? ? ? ?HPI ?Rebecca Navarro is a 21 y.o. at [redacted]w[redacted]d by LMP who presents for nausea & vomiting. Symptoms started 2 weeks ago. Reports vomiting 6 times per day. Today she vomited 9 times. Has prescription for phenergan. Hasn't taken it today. States the medication makes her too sleepy to work so can't take it during the day. Also reports 3-4 loose stools per day for the last 2 weeks. Denies fever, dysuria, vaginal bleeding, or abdominal pain.   ? ?OB History   ? ? Gravida  ?1  ? Para  ?   ? Term  ?   ? Preterm  ?   ? AB  ?   ? Living  ?   ?  ? ? SAB  ?   ? IAB  ?   ? Ectopic  ?   ? Multiple  ?   ? Live Births  ?   ?   ?  ?  ? ? ?Past Medical History:  ?Diagnosis Date  ? 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 Asthma   ? Attention deficit   ? COVID-19 07/2021  ? Depression   ? Self-harm 01/14/2016  ? ? ?Past Surgical History:  ?Procedure Laterality Date  ? CHOLECYSTECTOMY  08/2021  ? ? ?History reviewed. No pertinent family history. ? ?Allergies  ?Allergen Reactions  ? Bee Venom Anaphylaxis  ? Ibuprofen Hives  ? Trichophyton Hives and Shortness Of Breath  ? Diclofenac Hives and Swelling  ? Nsaids Swelling  ? ? ?No current facility-administered medications on file prior to encounter.  ? ?Current Outpatient Medications on File Prior to Encounter  ?Medication Sig Dispense Refill  ? folic acid (FOLVITE) 1 MG tablet Take 1 mg by mouth daily.    ? busPIRone (BUSPAR) 5 MG tablet Take 1 tablet (5 mg total) by mouth 2 (two) times daily. 60 tablet 0  ? cetirizine (ZYRTEC) 10 MG tablet Take 10 mg by mouth daily.    ? EPINEPHrine (EPI-PEN) 0.3 mg/0.3 mL SOAJ injection Inject 0.3 mLs (0.3 mg total) into the muscle once. 1 Device 0  ? fluticasone (FLONASE) 50 MCG/ACT nasal spray Place 1 spray into both nostrils daily.    ? Olopatadine HCl (PAZEO) 0.7 % SOLN Apply 1 drop to eye daily.    ?  promethazine (PHENERGAN) 25 MG tablet Take 25 mg by mouth every 6 (six) hours as needed.    ? venlafaxine XR (EFFEXOR-XR) 75 MG 24 hr capsule Take 1 capsule (75 mg total) by mouth daily with breakfast. 30 capsule 0  ? ? ? ?ROS ?Pertinent positives and negative per HPI, all others reviewed and negative ? ?Physical Exam  ? ?BP 115/75   Pulse 64   Temp 98.3 ?F (36.8 ?C) (Oral)   Resp 16   Ht 5\' 4"  (1.626 m)   Wt 84.4 kg   LMP 10/27/2021   SpO2 100%   BMI 31.93 kg/m?  ? ?Patient Vitals for the past 24 hrs: ? BP Temp Temp src Pulse Resp SpO2 Height Weight  ?12/22/21 1856 115/75 -- -- 64 -- -- -- --  ?12/22/21 1715 128/69 98.3 ?F (36.8 ?C) Oral 77 16 -- 5\' 4"  (1.626 m) 84.4 kg  ?12/22/21 1602 (!) 156/76 98.2 ?F (36.8 ?C) Oral 79 16 100 % -- --  ? ? ?Physical Exam ?Vitals  and nursing note reviewed.  ?Constitutional:   ?   General: She is not in acute distress. ?   Appearance: Normal appearance.  ?HENT:  ?   Head: Normocephalic and atraumatic.  ?Eyes:  ?   General: No scleral icterus. ?   Conjunctiva/sclera: Conjunctivae normal.  ?Pulmonary:  ?   Effort: Pulmonary effort is normal. No respiratory distress.  ?Neurological:  ?   Mental Status: She is alert.  ?Psychiatric:     ?   Mood and Affect: Mood normal.     ?   Behavior: Behavior normal.  ?  ? ?Labs ?Results for orders placed or performed during the hospital encounter of 12/22/21 (from the past 24 hour(s))  ?POC Urine Pregnancy, ED (not at [facility])     Status: Abnormal  ? Collection Time: 12/22/21  4:11 PM  ?Result Value Ref Range  ? Preg Test, Ur POSITIVE (A) NEGATIVE  ?Urinalysis, Routine w reflex microscopic Urine, Clean Catch     Status: Abnormal  ? Collection Time: 12/22/21  5:27 PM  ?Result Value Ref Range  ? Color, Urine YELLOW YELLOW  ? APPearance HAZY (A) CLEAR  ? Specific Gravity, Urine 1.026 1.005 - 1.030  ? pH 5.0 5.0 - 8.0  ? Glucose, UA NEGATIVE NEGATIVE mg/dL  ? Hgb urine dipstick NEGATIVE NEGATIVE  ? Bilirubin Urine NEGATIVE NEGATIVE  ? Ketones, ur  80 (A) NEGATIVE mg/dL  ? Protein, ur NEGATIVE NEGATIVE mg/dL  ? Nitrite NEGATIVE NEGATIVE  ? Leukocytes,Ua SMALL (A) NEGATIVE  ? RBC / HPF 0-5 0 - 5 RBC/hpf  ? WBC, UA 0-5 0 - 5 WBC/hpf  ? Bacteria, UA RARE (A) NONE SEEN  ? Squamous Epithelial / LPF 0-5 0 - 5  ? Mucus PRESENT   ? ? ?Imaging ?No results found. ? ?MAU Course  ?Procedures ?Lab Orders    ?     Urinalysis, Routine w reflex microscopic Urine, Clean Catch    ?     POC Urine Pregnancy, ED (not at [facility])    ?Meds ordered this encounter  ?Medications  ? lactated ringers bolus 1,000 mL  ? metoCLOPramide (REGLAN) injection 10 mg  ? famotidine (PEPCID) IVPB 20 mg premix  ? scopolamine (TRANSDERM-SCOP) 1 MG/3DAYS 1.5 mg  ? metoCLOPramide (REGLAN) 10 MG tablet  ?  Sig: Take 1 tablet (10 mg total) by mouth every 8 (eight) hours as needed for nausea.  ?  Dispense:  30 tablet  ?  Refill:  1  ?  Order Specific Question:   Supervising Provider  ?  Answer:   Levie Heritage [[id number]]  ? scopolamine (TRANSDERM-SCOP) 1 MG/3DAYS  ?  Sig: Place 1 patch (1.5 mg total) onto the skin every 3 (three) days.  ?  Dispense:  10 patch  ?  Refill:  0  ?  Order Specific Question:   Supervising Provider  ?  Answer:   Levie Heritage [[id number]]  ? ?Imaging Orders  ?No imaging studies ordered today  ? ? ?MDM ?Patient presents for treatment of nausea & vomiting. Given IV fluids, reglan, and pepcid. Not observed vomiting in MAU & reports improvement in symptoms.  ?Scopolamine patch applied.  ?Will d/c home with prescriptions.  ? ?Assessment and Plan  ? ?1. Nausea and vomiting during pregnancy prior to [redacted] weeks gestation   ?2. [redacted] weeks gestation of pregnancy   ? ?-Rx reglan & scopolamine ?-reviewed reasons to return to MAU ? ?Judeth Horn, NP ?12/22/21 ?9:38 PM ? ? ?

## 2021-12-22 NOTE — ED Provider Triage Note (Signed)
Emergency Medicine Provider Triage Evaluation Note ? ?Rebecca Navarro , a 21 y.o. female  was evaluated in triage.  Pt complains of nausea and vomiting for the past 2 to 3 weeks.  She has not yet established with OB/GYN and is waiting for her first appointment.  She is approximately [redacted] weeks pregnant.  She says for the past 3 weeks she has had persistent nausea and vomiting.  Today she has vomited 8 times and has been unable to keep food down.  She called the OB/GYN that she was supposed to go see, however they still cannot get her in today so they sent her to the emergency department to be evaluated.  This patient denies any abdominal pain or vaginal bleeding or discharge. She is not on any medication to help with her symptoms.  ? ?Review of Systems  ?Positive: Nausea/vomiting ?Negative:  ? ?Physical Exam  ?BP (!) 156/76 (BP Location: Right Arm)   Pulse 79   Temp 98.2 ?F (36.8 ?C) (Oral)   Resp 16   LMP 10/01/2021   SpO2 100%  ?Gen:   Awake, no distress   ?Resp:  Normal effort  ?MSK:   Moves extremities without difficulty  ?Other:   ? ?Medical Decision Making  ?Medically screening exam initiated at 4:09 PM.  Appropriate orders placed.  Rikayla Haruye Lainez was informed that the remainder of the evaluation will be completed by another provider, this initial triage assessment does not replace that evaluation, and the importance of remaining in the ED until their evaluation is complete. ? ?Waiting for positive pregnancy and will likely send to MAU. ?  Claudie Leach, PA-C ?12/22/21 1610 ? ?

## 2021-12-22 NOTE — MAU Note (Signed)
...  Rebecca Navarro is a 21 y.o. at Unknown here in MAU reporting: Has been experiencing n/v for the past two weeks. She states "65% of the two week period has been terrible." She states between 0800 and 1400 today she has thrown up 9 times. Denies pain or VB.  ? ?First virtual OB Appointment with [facility] this upcoming Thursday. In office visit on the 23rd of this month. ? ?LMP:  ?10/27/2021 ? ?Pain score: Denies pain ? ?Lab orders placed from triage: UA ? ?

## 2021-12-25 ENCOUNTER — Telehealth (INDEPENDENT_AMBULATORY_CARE_PROVIDER_SITE_OTHER): Payer: Medicaid Other

## 2021-12-25 DIAGNOSIS — Z3480 Encounter for supervision of other normal pregnancy, unspecified trimester: Secondary | ICD-10-CM

## 2021-12-25 DIAGNOSIS — Z3A00 Weeks of gestation of pregnancy not specified: Secondary | ICD-10-CM

## 2021-12-25 DIAGNOSIS — Z3402 Encounter for supervision of normal first pregnancy, second trimester: Secondary | ICD-10-CM | POA: Insufficient documentation

## 2021-12-25 MED ORDER — BLOOD PRESSURE MONITORING DEVI: 1.0000 | 0 refills | Status: DC

## 2021-12-25 MED ORDER — PRENATAL PLUS 27-1 MG PO TABS: 1.0000 | ORAL_TABLET | Freq: Every day | ORAL | 11 refills | Status: DC

## 2021-12-25 NOTE — Progress Notes (Signed)
New OB Intake ? ?I connected with  Rebecca Navarro on 12/25/21 at  9:15 AM EST by MyChart Video Visit and verified that I am speaking with the correct person using two identifiers. Nurse is located at [facility] and pt is located at [employer]. ? ?I discussed the limitations, risks, security and privacy concerns of performing an evaluation and management service by telephone and the availability of in person appointments. I also discussed with the patient that there may be a patient responsible charge related to this service. The patient expressed understanding and agreed to proceed. ? ?I explained I am completing New OB Intake today. We discussed her EDD of 08/03/22 that is based on LMP of 10/27/21. Pt is G1/P0. I reviewed her allergies, medications, Medical/Surgical/OB history, and appropriate screenings. I informed her of [facility] services. Based on history, this is a/an  pregnancy uncomplicated .  ? ?Patient Active Problem List  ? Diagnosis Date Noted  ? Chronic bilateral low back pain without sciatica 12/07/2016  ? Degenerative disc disease, lumbar 08/26/2016  ? 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 Self-harm 01/14/2016  ? Severe episode of recurrent major depressive disorder, without psychotic features (HCC)   ? ? ?Concerns addressed today ? ?Delivery Plans:  ?Plans to deliver at [facility].  ? ?Waterbirth candidate?  ? ?MyChart/Babyscripts ?MyChart access verified. I explained pt will have some visits in office and some virtually. Babyscripts instructions given and order placed. Patient verifies receipt of registration text/e-mail. Account successfully created and app downloaded. ? ?Blood Pressure Cuff  ?Blood pressure cuff ordered for patient to pick-up from [employer]. Explained after first prenatal appt pt will check weekly and document in Babyscripts. ? ?Weight scale: Patient does / does not  have weight scale. Weight scale ordered for patient to pick up from [employer].  ? ?Anatomy  US ?Explained first scheduled [place] will be around 19 weeks. Anatomy US scheduled for 03/09/22 at 0745. Pt notified to arrive at 0730. ?Scheduled AFP lab only appointment if CenteringPregnancy pt for same day as anatomy US.  ? ?Labs ?Discussed Avelina Laine genetic screening with patient. Would like both Panorama and Horizon drawn at new OB visit.Also if interested in genetic testing, tell patient she will need AFP 15-21 weeks to complete genetic testing .Routine prenatal labs needed. ? ?Covid Vaccine ?Patient has covid vaccine.  ? ?Is patient a CenteringPregnancy candidate? Declined  ? ?Is patient a Mom+Baby Combined Care candidate? Accepted    ? ?Informed patient of [facility] website  and placed link in her AVS.  ? ?Social Determinants of Health ?Food Insecurity: Patient denies food insecurity. ?WIC Referral: Patient is interested in referral to [facility].  ?Transportation: Patient denies transportation needs. ?Childcare: Discussed no children allowed at ultrasound appointments. Offered childcare services; patient declines childcare services at this time. ? ?Send link to Pregnancy Navigators ? ? ?Placed OB Box on problem list and updated ? ?First visit review ?I reviewed new OB appt with pt. I explained she will have a pelvic exam, ob bloodwork with genetic screening, and PAP smear. Explained pt will be seen by Dr. Crissie Reese at first visit; encounter routed to appropriate provider. Explained that patient will be seen by pregnancy navigator following visit with provider. [facility] information placed in AVS.  ? ?Henrietta Dine, CMA ?12/25/2021  9:45 AM  ?

## 2021-12-25 NOTE — Patient Instructions (Addendum)
AREA PEDIATRIC/FAMILY PRACTICE PHYSICIANS  [place]outheast [place] ([address])  [facility] Chambliss, MD; Eniola, MD; Hale, MD; Hensel, MD; McDiarmid, MD; McIntyer, MD; Hikari Tripp, MD; Walden, MD [address] [place], [place] [address] [phone] Mon-Fri 8:30-12:30, 1:30-5:00 Providers come to see babies at [facility] Accepting Medicaid [facility] providers who accept newborns: Koirala, MD; Morrow, MD; Wolters, MD [address], [place], [place] [address] [phone] Mon-Fri 8:00-5:30 Babies seen by providers at [facility] Does NOT accept Medicaid Please call early in hospitalization for appointment (limited availability)  [facility] Mulberry, MD [address] [place], [place] [address] [phone] Mon, Tue, Thur, Fri 8:30-5:00, Wed 10:00-7:00 (closed 1-2pm) Babies seen by [facility] providers Accepting Medicaid Rubin - Pediatrician Rubin, MD [address], [place], [place] [address] [phone] Mon-Fri 8:30-5:00, Sat 8:30-12:00 Provider comes to see babies at [facility] Accepting Medicaid Must have been referred from current patients or contacted office prior to delivery [facility] ([facility]) Brown, MD; Chandler, MD; Ettefagh, MD; Grant, MD; Lester, MD; McCormick, MD; McQueen, MD; Prose, MD; Simha, MD; Stanley, MD; Stryffeler, NP; Tebben, NP [address], [place], [place] [address] [phone] Mon, Tue, Thur, Fri 8:30-5:30, Wed 9:30-5:30, Sat 8:30-12:30 Babies seen by [facility] providers Accepting Medicaid Only accepting infants of first-time parents or siblings of current patients Hospital discharge coordinator will make follow-up appointment Jack Amos [address], [place], [place]  [address] [phone]   Fax - [phone] [facility] [address], [place], [place]  [address] Phone - [phone]   Fax - [phone] Shilpa Gosrani [address], [place], [place]  [address] [phone]  [place] ([address]) [facility] Bates, MD; [place], MD; Cooper, Cox, MD; MD; Davis, MD; Dovico, MD; Ettefaugh, MD; Little, MD; Lowe, MD; Keiffer, MD; Melvin, MD; Sumner, MD; Williams, MD [address], [place], [place] [address] [phone] Mon-Fri 8:30-5:00 (extended evenings Mon-Thur as needed), Sat-Sun 10:00-1:00 Providers come to see babies at [facility] Accepting Medicaid for families of first-time babies and families with all children in the household age 3 and under. Must register with office prior to making appointment (M-F only). [facility] Henson, NP; Knapp, MD; Lalonde, MD; Tysinger, PA [address] [place], [place] [address] [phone] Mon-Fri 8:00-5:00 Babies seen by providers at [facility] Does NOT accept Medicaid/Commercial Insurance Only [facility] [place] ([facility])  Artis, MD; Barnes, MD; Bratton, MD; Coccaro, MD; Lockett Gardner, MD; Kramer, MD; Marshall, MD; Netherton, MD; Poleto, MD; Skinner, MD [address] [place], [place] [address] [phone] Mon-Fri 8:30-5:30, Sat (Oct.-Mar.) 9:00-1:00 Babies seen by providers at [facility] Accepting Medicaid  [place] ([address]) [facility] Reid, MD; Warner, MD [address], [place], [place] [address] [phone] Mon-Fri 8:30-5:00, Sat 8:30-12:00 Providers come to see babies at [facility] Does NOT accept Medicaid [facility] Becker, PA; Hagler, MD; Scifres, PA; Sun, MD; Swayne, MD [address], [place],  [address] [phone] Mon-Fri 8:00-5:00 Babies seen by providers at [facility] Does NOT accept Medicaid Only accepting babies of parents who  are patients Please call early in hospitalization for appointment (limited availability)  Pediatricians Clark, MD; Frye, MD; Kelleher, MD; Mack, NP; Miller, MD; O'Keller, MD; Patterson, NP; Pudlo, MD; Puzio, MD; Thomas, MD; Tucker, MD; Twiselton, MD [address], [place], [place] [address] [phone] Mon-Fri 8:00-5:00, Sat 9:00-12:00 Providers come to see babies at [facility] Does NOT accept Medicaid  [place] ([address]) [facility] providers accepting new patients: Brake, NP; Wharton, PA [address], [place], [place] [address] [phone] Mon-Fri 8:00-5:00 Babies seen by providers at [facility] Does NOT accept Medicaid Only accepting babies of parents who are patients Please call early in hospitalization for appointment (limited availability) [facility] Gay, MD; Quinlan, MD [address] [place], [place] [address] [phone] (press 1 to schedule appointment) Mon-Fri 8:00-5:00 Providers come to see babies at [facility] Does NOT accept Medicaid [facility] Mazer, MD [address] [place], [place] [address] [phone] Mon-Fri 8:30-5:00 (lunch 12:30-1:00), extended hours by appointment only Wed 5:00-6:30 Babies seen by [facility] providers Accepting Medicaid [facility] Banks, MD; Jordan, MD; Koberlein, MD [address], [place], [place] [address] [phone] Mon-Fri 8:00-5:00 Babies seen by [facility] providers Does NOT accept Medicaid [facility] Parker, MD; Hunter, MD; Wallace, DO [address] [place], [place] [address] [phone] Mon-Fri 8:00-5:00 Babies seen by [facility] providers Does NOT accept Medicaid [facility] Brandon, PA; Brecken, PA; Christy, NP; Dees, MD; DeClaire, MD; DeWeese, MD; Hansen, NP; Mills, NP; Parrish, NP; Smoot, NP; Summer, MD; Vapne,  MD [address] [place], [place] [address] [phone] Mon-Fri 8:30-5:00, Sat 10:00-1:00 Providers come to see babies at [facility] Does NOT accept Medicaid Free prenatal information session Tuesdays at 4:45pm [facility] Bouska, MD; Gordon, PA; Jeffery, PA; Weber, PA [address] [place] [address] [phone] Mon-Fri 7:30-5:30 Babies seen by [facility] providers [facility] [address], [place], [place]  [address] [phone]   Fax - [phone]  [place] ([address] & [address]) [facility] [place] [address] [place], [place] [address] [phone] Mon-Thur 8:00-6:00 Providers come to see babies at [facility] Accepting Medicaid [facility] Anderson, NP; Badger, MD; Beal, PA; Spencer, PA [address] [place], [place] [address] [phone] Mon-Thur 7:30-7:30, Fri 7:30-4:30 Babies seen by [facility] providers Accepting Medicaid [facility] Agbuya, MD; Klett, NP; Romgoolam, MD [address], [place], [place] [address] [phone] Mon-Fri 8:30-5:00, Sat 8:30-12:00 Providers come to see babies at [facility] Accepting Medicaid Must have "Meet & Greet" appointment at office prior to delivery [facility] ([facility]) McCord, MD; Wallace, MD; Wood, MD [address], [place], [place] [address] [phone] Mon-Wed 8:00-6:00, Thur-Fri 8:00-5:00, Sat 9:00-12:00 Providers come to see babies at [facility] Does NOT accept Medicaid Only accepting siblings of current patients [facility]  [address], [place], [place]  [address] [phone]   Fax - [phone] [facility] [address], [place], [place]  [address] [phone]   Fax -  [phone]  [place] ([address] & [address])  [facility] [facility] Cirigliano, DO; Matthews, DO [address] [place],  [address] [phone] Mon-Fri 7:00-5:00 Babies seen by [facility] providers Does NOT accept Medicaid [facility] Briscoe, MD; Howley, PA; Moreira, PA [address], [place],  [address] [phone] Mon-Fri 8:00-5:00 Babies seen by [facility] providers Accepting Medicaid [facility] Boyd, MD; Church, PA; Jones, NP; Osborn, PA [address], [place],  [address] [phone] Mon-Fri 8:00-5:00 Babies seen by providers at [facility] Accepting Medicaid  [place] ([address]) [facility] [place] Wendling, DO [address] [place], [place] [address] [phone] Mon-Fri 8:00-5:00 Babies seen by [facility] providers Does NOT accept Medicaid Limited availability, please call early in hospitalization to schedule follow-up [facility] Calderon, PA; Cummings, MD; Dillard, MD; Martin, PA; Olson, MD; VanDeven, PA [address], [place], [place] [address] [phone] Mon-Fri 8:30-5:00, Sat 9:00-12:00 Babies seen by providers at [facility] Accepting Medicaid Please register online then schedule online or call office [email or web address]) Hunter, NP; Kumar, MD; Martin Rogers, PA [address], [place], [place] [address] [phone] Mon-Fri 8:00-5:00 Babies seen by providers at [facility] Accepting Medicaid [facility] ([facility]) [place], MD; Kristi Fleenor, NP; [place], MD [address], [place], [place] [address] [phone] Mon-Fri 8:00-5:30, Sat&Sun by appointment (phones open at  8:30) Babies seen by [facility] providers Accepting Medicaid Must be a first-time baby or sibling of current patient [facility] [place]  [address], [place], [place]  [address] [phone]   Fax - [phone]  [place] ([address]) [facility] Brown, PA; Cowen, PA; Rice, MD; Helton, PA; Spry, MD [address] [place], [place] [address] [phone] Mon-Thur 8:00-7:00, Fri 8:00-5:00, Sat 8:00-12:00, Sun 9:00-12:00 Babies seen by [facility] providers Accepting Medicaid [facility] [place] Coe-Goins, MD; Marshall, MD; Pierre-Louis, MD [address] [place] [address], [place], [place] [address] [phone] Mon-Thur 8:00-5:00 Babies seen by providers at [facility] Accepting Medicaid [facility] Bratton, MD; Coe-Goins, MD; Hayes, MD; Lewis, MD; List, MD; Lott, MD; Marshall, MD; Moran, MD; O'Rishikesh Khachatryan, MD; Pierre-Louis, MD; Pitonzo, MD; Scholer, MD; Spangle, MD [address] [place], [place] [address] [phone] Mon-Fri 8:00-5:30, Sat (Oct.-Mar.) 9:00-1:00 Babies seen by providers at [facility] Accepting Medicaid Must fill out new patient packet, available online at [email or web address] [facility] ([facility]) Friddle, NP; Harris, NP; Kelly, NP; Logan, MD; Melvin, PA; Poth, MD; Ramadoss, MD; Stanton, NP [address], [place], [place] [address] [phone] Mon-Thur 8:00-5:30, Fri 8:00-5:00 Babies seen by providers at [facility] Accepting Medicaid  [place] ([address]) [facility] Dixon, PA; Aromas, MD; Pickard, MD; Tapia, PA [address], [place], [place] [address] [phone] Mon-Fri 8:00-5:00 Babies seen by providers at [facility] Accepting Medicaid   [place] ([address]) [facility] [place] Masneri, DO; Meyers, MD; Nelson, PA [address], [place], [place] [address] [phone] Mon-Fri 8:00-5:00 Babies seen by providers at [facility] Does NOT accept Medicaid Limited appointment availability, please call early in hospitalization  [facility] [place] Kunedd, DO; McGowen, MD [address], [place], [place] [address] [phone] Mon-Fri 8:00-5:00 Babies seen by [facility] providers Does NOT accept Medicaid [facility] [place] Cameron, MD; MacDonald, MD; Michaels, PA; Nayak, MD [address] [place] [address], [place], [place] [address] [phone] Mon-Fri 8:00-5:00 After hours clinic ([address] [place], [place] [address]) [phone] Mon-Fri 5:00-8:00, Sat 12:00-6:00, Sun 10:00-4:00 Babies seen by [facility] providers Accepting Medicaid [facility] [place] [address], [place], [place]  [address] [phone]   Fax - [phone]  [place] [phone]) Adult nurse [facility], MD [address] [place], [place], [place] [address] [phone] Mon-Fri 8:00-5:00 Babies seen by [facility] providers Does NOT accept Medicaid [facility] [place] [facility] [place]) Rene Kocher, MD [address] [place], [place] [address] [phone] Mon-Thur 8:00-7:00, Fri 8:00-5:00, Sat 8:00-12:00 Babies seen by providers at [facility] Accepting Medicaid - but does not have vaccinations in office (must be received elsewhere) Limited availability, please call early in hospitalization  [place] [phone]) [facility]  Wyvonne Lenz, MD [address], [place] [address] [phone]  Fax [phone]  [facility]  Lyndel Safe, MD, [place], [place], [place], [place] [address] [place], [place]  [address] [phone] [facility]  [address] Sherian Maroon [place], [place] [address] [phone] [address], [place], [place] [address] [phone] [facility] Office)  [facility] ([place]) [address] [place], [place] [address] [phone] [facility] [address] [place], [place], [place] [address] [phone] [facility] [address], [place], [place] [address] [phone] [facility] [address], [place], [place] [address] [phone] [facility] [address], [place], [place] [address] [phone] [facility] [address] [place], [place] [address] [phone] [facility]  [address] [place], [place] [address] [phone] Dr. Belia Heman. [place] [address] [place], [place] [address] [phone] [facility] [address], [place], [place] [address] [phone] [facility] [address] [place], [place] [address] [phone]  Safe Medications in Pregnancy   Acne:  Benzoyl Peroxide  Salicylic Acid   Backache/Headache:  Tylenol: 2 regular strength every 4 hours OR               2 Extra strength every 6 hours   Colds/Coughs/Allergies:  Benadryl (alcohol free) 25 mg every 6 hours as needed  Breath right strips  Claritin  Cepacol throat lozenges  Chloraseptic throat spray  Cold-Eeze- up to three times per day  Cough drops, alcohol free  Flonase (by prescription only)  Guaifenesin  Mucinex  Robitussin DM (plain only, alcohol free)  Saline nasal spray/drops  Sudafed (pseudoephedrine) & Actifed * use only after [redacted] weeks gestation and if you do not have high blood pressure  Tylenol  Vicks Vaporub  Zinc lozenges  Zyrtec   Constipation:  Colace  Ducolax suppositories  Fleet enema  Glycerin suppositories  Metamucil  Milk of magnesia  Miralax  Senokot  Smooth move tea   Diarrhea:  Kaopectate  Imodium A-D    *NO pepto Bismol   Hemorrhoids:  Anusol  Anusol HC  Preparation H  Tucks   Indigestion:  Tums  Maalox  Mylanta  Zantac  Pepcid   Insomnia:  Benadryl (alcohol free) 25mg  every 6 hours as needed  Tylenol PM  Unisom, no Gelcaps   Leg Cramps:  Tums  MagGel   Nausea/Vomiting:  Bonine  Dramamine  Emetrol  Ginger extract  Sea bands  Meclizine  Nausea medication to take during pregnancy:  Unisom (doxylamine succinate 25 mg tablets) Take one tablet daily at bedtime. If symptoms are not adequately controlled, the dose can be increased to a maximum recommended dose of two tablets daily (1/2 tablet in the morning, 1/2 tablet mid-afternoon and one at bedtime).  Vitamin B6 100mg  tablets. Take one tablet twice a day (up  to 200 mg per day).   Skin Rashes:  Aveeno products  Benadryl cream or 25mg  every 6 hours as needed  Calamine Lotion  1% cortisone cream   Yeast infection:  Gyne-lotrimin 7  Monistat 7    **If taking multiple medications, please check labels to avoid duplicating the same active ingredients  **take medication as directed on the label  ** Do not exceed 4000 mg of tylenol in 24 hours  **Do not take medications that contain aspirin or ibuprofen

## 2022-01-02 ENCOUNTER — Inpatient Hospital Stay (HOSPITAL_COMMUNITY)
Admission: AD | Admit: 2022-01-02 | Discharge: 2022-01-02 | Disposition: A | Discharge status: Home / Self Care | Payer: Medicaid Other | Attending: Obstetrics and Gynecology | Admitting: Obstetrics and Gynecology

## 2022-01-02 ENCOUNTER — Other Ambulatory Visit: Payer: Self-pay

## 2022-01-02 DIAGNOSIS — Z3491 Encounter for supervision of normal pregnancy, unspecified, first trimester: Secondary | ICD-10-CM

## 2022-01-02 DIAGNOSIS — I10 Essential (primary) hypertension: Secondary | ICD-10-CM | POA: Diagnosis not present

## 2022-01-02 DIAGNOSIS — O219 Vomiting of pregnancy, unspecified: Secondary | ICD-10-CM | POA: Diagnosis not present

## 2022-01-02 DIAGNOSIS — Z8616 Personal history of COVID-19: Secondary | ICD-10-CM | POA: Insufficient documentation

## 2022-01-02 DIAGNOSIS — O10911 Unspecified pre-existing hypertension complicating pregnancy, first trimester: Secondary | ICD-10-CM | POA: Insufficient documentation

## 2022-01-02 DIAGNOSIS — Z79899 Other long term (current) drug therapy: Secondary | ICD-10-CM | POA: Insufficient documentation

## 2022-01-02 DIAGNOSIS — O26891 Other specified pregnancy related conditions, first trimester: Secondary | ICD-10-CM | POA: Diagnosis not present

## 2022-01-02 DIAGNOSIS — Z3A09 9 weeks gestation of pregnancy: Secondary | ICD-10-CM | POA: Diagnosis not present

## 2022-01-02 DIAGNOSIS — O99210 Obesity complicating pregnancy, unspecified trimester: Secondary | ICD-10-CM

## 2022-01-02 DIAGNOSIS — H538 Other visual disturbances: Secondary | ICD-10-CM | POA: Insufficient documentation

## 2022-01-02 DIAGNOSIS — Z3480 Encounter for supervision of other normal pregnancy, unspecified trimester: Secondary | ICD-10-CM

## 2022-01-02 LAB — PROTEIN / CREATININE RATIO, URINE
Creatinine, Urine: 52.11 mg/dL
Total Protein, Urine: <6 mg/dL

## 2022-01-02 LAB — COMPREHENSIVE METABOLIC PANEL
ALT: 19 U/L (ref 0–44)
AST: 17 U/L (ref 15–41)
Albumin: 4.1 g/dL (ref 3.5–5.0)
Alkaline Phosphatase: 53 U/L (ref 38–126)
Anion gap: 8 (ref 5–15)
BUN: 5 mg/dL — ABNORMAL LOW (ref 6–20)
CO2: 23 mmol/L (ref 22–32)
Calcium: 8.9 mg/dL (ref 8.9–10.3)
Chloride: 105 mmol/L (ref 98–111)
Creatinine, Ser: 0.59 mg/dL (ref 0.44–1.00)
GFR, Estimated: >60 mL/min (ref >60)
Glucose, Bld: 85 mg/dL (ref 70–99)
Potassium: 3.5 mmol/L (ref 3.5–5.1)
Sodium: 136 mmol/L (ref 135–145)
Total Bilirubin: 0.4 mg/dL (ref 0.3–1.2)
Total Protein: 7 g/dL (ref 6.5–8.1)

## 2022-01-02 LAB — CBC
HCT: 38.7 % (ref 36.0–46.0)
Hemoglobin: 13.6 g/dL (ref 12.0–15.0)
MCH: 30 pg (ref 26.0–34.0)
MCHC: 35.1 g/dL (ref 30.0–36.0)
MCV: 85.2 fL (ref 80.0–100.0)
Platelets: 296 10*3/uL (ref 150–400)
RBC: 4.54 MIL/uL (ref 3.87–5.11)
RDW: 12.5 % (ref 11.5–15.5)
WBC: 8.2 10*3/uL (ref 4.0–10.5)
nRBC: 0 % (ref 0.0–0.2)

## 2022-01-02 LAB — RAPID URINE DRUG SCREEN, HOSP PERFORMED
Amphetamines: NOT DETECTED
Barbiturates: NOT DETECTED
Benzodiazepines: NOT DETECTED
Cocaine: NOT DETECTED
Opiates: NOT DETECTED
Tetrahydrocannabinol: POSITIVE — AB

## 2022-01-02 LAB — ABO/RH: ABO/RH(D): O POS

## 2022-01-02 MED ORDER — LABETALOL HCL 100 MG PO TABS
100.0000 mg | ORAL_TABLET | Freq: Once | ORAL | Status: AC
  Administered 2022-01-02: 100 mg via ORAL
  Filled 2022-01-02: qty 1

## 2022-01-02 MED ORDER — LABETALOL HCL 100 MG PO TABS: 100.0000 mg | ORAL_TABLET | Freq: Two times a day (BID) | ORAL | 0 refills | Status: DC

## 2022-01-02 MED ORDER — PROCHLORPERAZINE MALEATE 10 MG PO TABS: 10.0000 mg | ORAL_TABLET | Freq: Four times a day (QID) | ORAL | 3 refills | Status: DC | PRN

## 2022-01-02 NOTE — MAU Provider Note (Signed)
?History  ?  ? ?CSN: [id number] ? ?Arrival date and time: 01/02/22 1518 ? ? Event Date/Time  ? First Provider Initiated Contact with Patient 01/02/22 1606   ?  ? ?Chief Complaint  ?Patient presents with  ? Elevated BP  ? ?HPI ? ?Rebecca Navarro is a 21 y.o. G1P0 at [redacted]w[redacted]d who presents to MAU for evaluation of elevated blood pressure readings on her home cuff. Patient endorses readings of 140s-150s/80s-90s throughout the week. She reports strong family history of "blood pressure problems and Preeclampsia" in her mom and sister, who just had a baby. She also c/o intermittently blurry vision but states she has not been wearing her glasses and thinks that is the cause of her vision change. ? ?Patient requests alternate medication for recurrent vomiting in pregnancy. She has a prescription for Phenergan but it "knocks me out" so she can only take it at night. She was previously successful with the Scopolamine patch but developed an itchy rash at the application site and so discontinued use. ? ?She denies abdominal pain, vaginal bleeding, dysuria, fever or recent illness. ? ?OB History   ? ? Gravida  ?1  ? Para  ?   ? Term  ?   ? Preterm  ?   ? AB  ?   ? Living  ?   ?  ? ? SAB  ?   ? IAB  ?   ? Ectopic  ?   ? Multiple  ?   ? Live Births  ?   ?   ?  ?  ? ? ?Past Medical History:  ?Diagnosis Date  ? 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 Asthma   ? Attention deficit   ? Complication of anesthesia   ? COVID-19 07/2021  ? Depression   ? Self-harm 01/14/2016  ? ? ?Past Surgical History:  ?Procedure Laterality Date  ? CHOLECYSTECTOMY  08/2021  ? ? ?No family history on file. ? ?Social History  ? ?Tobacco Use  ? Smoking status: Never  ?  Passive exposure: Yes  ? Smokeless tobacco: Never  ?Vaping Use  ? Vaping Use: Every day  ?Substance Use Topics  ? Alcohol use: Not Currently  ? Drug use: Yes  ?  Frequency: 7.0 times per week  ?  Types: Marijuana  ?  Comment: pt states every day use as of 12/22/21  ? ? ?Allergies:   ?Allergies  ?Allergen Reactions  ? Bee Venom Anaphylaxis  ? Ibuprofen Hives  ? Trichophyton Hives and Shortness Of Breath  ? Diclofenac Hives and Swelling  ? Nsaids Swelling  ? ? ?Medications Prior to Admission  ?Medication Sig Dispense Refill Last Dose  ? Blood Pressure Monitoring DEVI 1 each by Does not apply route once a week. 1 each 0   ? busPIRone (BUSPAR) 5 MG tablet Take 1 tablet (5 mg total) by mouth 2 (two) times daily. (Patient not taking: Reported on 12/25/2021) 60 tablet 0   ? cetirizine (ZYRTEC) 10 MG tablet Take 10 mg by mouth daily. (Patient not taking: Reported on 12/25/2021)     ? EPINEPHrine (EPI-PEN) 0.3 mg/0.3 mL SOAJ injection Inject 0.3 mLs (0.3 mg total) into the muscle once. (Patient not taking: Reported on 12/25/2021) 1 Device 0   ? fluticasone (FLONASE) 50 MCG/ACT nasal spray Place 1 spray into both nostrils daily. (Patient not taking: Reported on 12/25/2021)     ? folic acid (FOLVITE) 1 MG tablet Take 1 mg by mouth daily.     ?  metoCLOPramide (REGLAN) 10 MG tablet Take 1 tablet (10 mg total) by mouth every 8 (eight) hours as needed for nausea. 30 tablet 1   ? Olopatadine HCl (PAZEO) 0.7 % SOLN Apply 1 drop to eye daily.     ? prenatal vitamin w/FE, FA (PRENATAL 1 + 1) 27-1 MG TABS tablet Take 1 tablet by mouth daily at 12 noon. 30 tablet 11   ? scopolamine (TRANSDERM-SCOP) 1 MG/3DAYS Place 1 patch (1.5 mg total) onto the skin every 3 (three) days. 10 patch 0   ? venlafaxine XR (EFFEXOR-XR) 75 MG 24 hr capsule Take 1 capsule (75 mg total) by mouth daily with breakfast. (Patient not taking: Reported on 12/25/2021) 30 capsule 0   ? ? ?Review of Systems  ?Constitutional:  Positive for fatigue.  ?Gastrointestinal:  Positive for nausea and vomiting.  ?All other systems reviewed and are negative. ?Physical Exam  ? ?Height 5' 4.5" (1.638 m), weight 85.3 kg, last menstrual period 10/27/2021. ? ?Physical Exam ?Vitals and nursing note reviewed. Exam conducted with a chaperone present.  ?Constitutional:   ?    Appearance: Normal appearance.  ?Cardiovascular:  ?   Rate and Rhythm: Normal rate and regular rhythm.  ?   Pulses: Normal pulses.  ?   Heart sounds: Normal heart sounds.  ?Pulmonary:  ?   Effort: Pulmonary effort is normal.  ?   Breath sounds: Normal breath sounds.  ?Abdominal:  ?   General: Abdomen is flat.  ?   Tenderness: There is no abdominal tenderness. There is no right CVA tenderness or left CVA tenderness.  ?Skin: ?   Capillary Refill: Capillary refill takes less than 2 seconds.  ?Neurological:  ?   Mental Status: She is alert and oriented to person, place, and time.  ?Psychiatric:     ?   Mood and Affect: Mood normal.     ?   Behavior: Behavior normal.     ?   Thought Content: Thought content normal.     ?   Judgment: Judgment normal.  ? ? ?MAU Course  ?Procedures ? ?MDM ? ?--Scattered elevated blood pressure readings on EMR. Mildly elevated in [facility]. [facility] and is above threshold for initiating medication per current policy for [facility]. Patient agreeable to initiating medication. Will start Labetalol 100 mg BID. ? ?--Known THC use. Abstinence advised ? ?Patient Vitals for the past 24 hrs: ? BP Temp Temp src Pulse Resp SpO2 Height Weight  ?01/02/22 1749 (!) 146/62 98.1 ?F (36.7 ?C) Oral 80 20 100 % -- --  ?01/02/22 1624 (!) 146/80 98.1 ?F (36.7 ?C) Oral 85 18 100 % -- --  ?01/02/22 1616 -- -- -- -- -- -- 5' 4.5" (1.638 m) 85.3 kg  ? ?Results for orders placed or performed during the hospital encounter of 01/02/22 (from the past 24 hour(s))  ?Protein / creatinine ratio, urine     Status: None  ? Collection Time: 01/02/22  4:20 PM  ?Result Value Ref Range  ? Creatinine, Urine 52.11 mg/dL  ? Total Protein, Urine <6 mg/dL  ? Protein Creatinine Ratio        0.00 - 0.15 mg/mg[Cre]  ?Rapid urine drug screen (hospital performed)     Status: Abnormal  ? Collection Time: 01/02/22  4:20 PM  ?Result Value Ref Range  ? Opiates NONE DETECTED NONE DETECTED  ? Cocaine NONE DETECTED NONE DETECTED  ?  Benzodiazepines NONE DETECTED NONE DETECTED  ? Amphetamines NONE DETECTED NONE DETECTED  ?  Tetrahydrocannabinol POSITIVE (A) NONE DETECTED  ? Barbiturates NONE DETECTED NONE DETECTED  ?CBC     Status: None  ? Collection Time: 01/02/22  4:24 PM  ?Result Value Ref Range  ? WBC 8.2 4.0 - 10.5 K/uL  ? RBC 4.54 3.87 - 5.11 MIL/uL  ? Hemoglobin 13.6 12.0 - 15.0 g/dL  ? HCT 38.7 36.0 - 46.0 %  ? MCV 85.2 80.0 - 100.0 fL  ? MCH 30.0 26.0 - 34.0 pg  ? MCHC 35.1 30.0 - 36.0 g/dL  ? RDW 12.5 11.5 - 15.5 %  ? Platelets 296 150 - 400 K/uL  ? nRBC 0.0 0.0 - 0.2 %  ?Comprehensive metabolic panel     Status: Abnormal  ? Collection Time: 01/02/22  4:24 PM  ?Result Value Ref Range  ? Sodium 136 135 - 145 mmol/L  ? Potassium 3.5 3.5 - 5.1 mmol/L  ? Chloride 105 98 - 111 mmol/L  ? CO2 23 22 - 32 mmol/L  ? Glucose, Bld 85 70 - 99 mg/dL  ? BUN 5 (L) 6 - 20 mg/dL  ? Creatinine, Ser 0.59 0.44 - 1.00 mg/dL  ? Calcium 8.9 8.9 - 10.3 mg/dL  ? Total Protein 7.0 6.5 - 8.1 g/dL  ? Albumin 4.1 3.5 - 5.0 g/dL  ? AST 17 15 - 41 U/L  ? ALT 19 0 - 44 U/L  ? Alkaline Phosphatase 53 38 - 126 U/L  ? Total Bilirubin 0.4 0.3 - 1.2 mg/dL  ? GFR, Estimated >60 >60 mL/min  ? Anion gap 8 5 - 15  ?ABO/Rh     Status: None  ? Collection Time: 01/02/22  4:24 PM  ?Result Value Ref Range  ? ABO/RH(D) O POS   ? No rh immune globuloin    ?  NOT A RH IMMUNE GLOBULIN CANDIDATE, PT RH POSITIVE ?Performed at [facility], [address] [place], [place] [address] ?  ? ?Meds ordered this encounter  ?Medications  ? labetalol (NORMODYNE) tablet 100 mg  ? labetalol (NORMODYNE) 100 MG tablet  ?  Sig: Take 1 tablet (100 mg total) by mouth 2 (two) times daily.  ?  Dispense:  60 tablet  ?  Refill:  0  ?  Order Specific Question:   Supervising Provider  ?  Answer:   Duane Lope H [[id number]]  ? prochlorperazine (COMPAZINE) 10 MG tablet  ?  Sig: Take 1 tablet (10 mg total) by mouth every 6 (six) hours as needed for nausea or vomiting.  ?  Dispense:  30 tablet  ?  Refill:  3  ?   Order Specific Question:   Supervising Provider  ?  Answer:   Duane Lope H [[id number]]  ? ? ? ?Assessment and Plan  ?--21 y.o. G1P0 at [redacted]w[redacted]d  ?--FHT 155 ?--CHTN, Labetalol initiated today ?--New rx Compazin

## 2022-01-02 NOTE — MAU Note (Signed)
.  Rebecca Navarro is a 21 y.o. at [redacted]w[redacted]d here in MAU reporting: elevated BP, states 171/96 when taken @ work today.  ? ?Onset of complaint: today ?Pain score: 0 ?Vitals:  ? 01/02/22 1624  ?BP: (!) 146/80  ?Pulse: 85  ?Resp: 18  ?Temp: 98.1 ?F (36.7 ?C)  ?SpO2: 100%  ?   ?FHT: N/A ?Lab orders placed from triage:   U/A ?

## 2022-01-08 ENCOUNTER — Encounter: Payer: Medicaid Other | Admitting: Student

## 2022-01-13 ENCOUNTER — Ambulatory Visit (INDEPENDENT_AMBULATORY_CARE_PROVIDER_SITE_OTHER): Payer: Medicaid Other | Admitting: Family Medicine

## 2022-01-13 ENCOUNTER — Other Ambulatory Visit: Payer: Self-pay

## 2022-01-13 ENCOUNTER — Encounter: Payer: Self-pay | Admitting: Family Medicine

## 2022-01-13 VITALS — BP 132/77 | HR 86 | Wt 184.3 lb

## 2022-01-13 DIAGNOSIS — O99210 Obesity complicating pregnancy, unspecified trimester: Secondary | ICD-10-CM

## 2022-01-13 DIAGNOSIS — Z113 Encounter for screening for infections with a predominantly sexual mode of transmission: Secondary | ICD-10-CM

## 2022-01-13 DIAGNOSIS — O10911 Unspecified pre-existing hypertension complicating pregnancy, first trimester: Secondary | ICD-10-CM

## 2022-01-13 DIAGNOSIS — Z3480 Encounter for supervision of other normal pregnancy, unspecified trimester: Secondary | ICD-10-CM

## 2022-01-13 NOTE — Progress Notes (Signed)
?  ? ?Subjective:  ? ?Rebecca Navarro is a 21 y.o. G1P0 at [redacted]w[redacted]d by sure and regular LMP being seen today for her first obstetrical visit.  Her obstetrical history is significant for  chronic hypertension . Patient does intend to breast feed. Pregnancy history fully reviewed. ? ?Patient reports no complaints. ? ?HISTORY: ?OB History  ?Gravida Para Term Preterm AB Living  ?1 0 0 0 0 0  ?SAB IAB Ectopic Multiple Live Births  ?0 0 0 0 0  ?  ?# Outcome Date GA Lbr Len/2nd Weight Sex Delivery Anes PTL Lv  ?1 Current           ?  ? ?Last pap smear: ?No results found for: DIAGPAP, HPV, HPVHIGH ?N/a ? ?Past Medical History:  ?Diagnosis Date  ? 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 Asthma   ? Attention deficit   ? Complication of anesthesia   ? COVID-19 07/2021  ? Depression   ? Self-harm 01/14/2016  ? ?Past Surgical History:  ?Procedure Laterality Date  ? CHOLECYSTECTOMY  08/2021  ? ?History reviewed. No pertinent family history. ?Social History  ? ?Tobacco Use  ? Smoking status: Never  ?  Passive exposure: Yes  ? Smokeless tobacco: Never  ?Vaping Use  ? Vaping Use: Every day  ?Substance Use Topics  ? Alcohol use: Not Currently  ? Drug use: Yes  ?  Frequency: 7.0 times per week  ?  Types: Marijuana  ?  Comment: pt states every day use as of 12/22/21  ? ?Allergies  ?Allergen Reactions  ? Bee Venom Anaphylaxis  ? Ibuprofen Hives  ? Trichophyton Hives and Shortness Of Breath  ? Diclofenac Hives and Swelling  ? Nsaids Swelling  ? ?Current Outpatient Medications on File Prior to Visit  ?Medication Sig Dispense Refill  ? Blood Pressure Monitoring DEVI 1 each by Does not apply route once a week. 1 each 0  ? metoCLOPramide (REGLAN) 10 MG tablet Take 1 tablet (10 mg total) by mouth every 8 (eight) hours as needed for nausea. 30 tablet 1  ? prenatal vitamin w/FE, FA (PRENATAL 1 + 1) 27-1 MG TABS tablet Take 1 tablet by mouth daily at 12 noon. 30 tablet 11  ? folic acid (FOLVITE) 1 MG tablet Take 1 mg by mouth daily.  (Patient not taking: Reported on 01/13/2022)    ? labetalol (NORMODYNE) 100 MG tablet Take 1 tablet (100 mg total) by mouth 2 (two) times daily. (Patient not taking: Reported on 01/13/2022) 60 tablet 0  ? prochlorperazine (COMPAZINE) 10 MG tablet Take 1 tablet (10 mg total) by mouth every 6 (six) hours as needed for nausea or vomiting. (Patient not taking: Reported on 01/13/2022) 30 tablet 3  ? scopolamine (TRANSDERM-SCOP) 1 MG/3DAYS Place 1 patch (1.5 mg total) onto the skin every 3 (three) days. (Patient not taking: Reported on 01/13/2022) 10 patch 0  ? venlafaxine XR (EFFEXOR-XR) 75 MG 24 hr capsule Take 1 capsule (75 mg total) by mouth daily with breakfast. (Patient not taking: Reported on 12/25/2021) 30 capsule 0  ? ?No current facility-administered medications on file prior to visit.  ? ? ? ?Exam  ? ?Vitals:  ? 01/13/22 1349  ?BP: 132/77  ?Pulse: 86  ?Weight: 184 lb 4.8 oz (83.6 kg)  ? ?Fetal Heart Rate (bpm): 157 ? ?System: General: well-developed, well-nourished female in no acute distress  ? Skin: normal coloration and turgor, no rashes  ? Neurologic: oriented, normal, negative, normal mood  ? Extremities: normal strength,  tone, and muscle mass, ROM of all joints is normal  ? HEENT PERRLA, extraocular movement intact and sclera clear, anicteric  ? Neck supple and no masses  ? Respiratory:  no respiratory distress  ? ? ?  ?Assessment:  ? ?Pregnancy: G1P0 ?Patient Active Problem List  ? Diagnosis Date Noted  ? Chronic hypertension in obstetric context in first trimester 01/02/2022  ? Obesity in pregnancy 01/02/2022  ? Supervision of other normal pregnancy, antepartum 12/25/2021  ? Chronic bilateral low back pain without sciatica 12/07/2016  ? Degenerative disc disease, lumbar 08/26/2016  ? 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 Self-harm 01/14/2016  ? Severe episode of recurrent major depressive disorder, without psychotic features (HCC)   ? ?  ?Plan:  ?1. Supervision of other normal pregnancy,  antepartum ?BP and FHR normal ?Initial labs drawn. ?Continue prenatal vitamins. ?Genetic Screening discussed, NIPS: ordered. ?Ultrasound discussed; fetal anatomic survey: ordered. ?Problem list reviewed and updated. ?The nature of [facility] clinic was explained to patient; Voiced they may need to be seen by other [facility] providers which includes family medicine physicians, OB GYNs, and APPs. Delivery will hopefully be with one of the [facility] providers or another [facility] Medicine physician and we cannot promise this at this time.  Discussed there are [facility] staff in the hospital 24-7 and they understand and support this model and there is a likelihood one of these providers will catch their baby.  We also discussed that the service includes learners (residents, student) and they will be involved in the care team.  ? ?2. Chronic hypertension in obstetric context in first trimester ?Multiple elevated readings on prior visits to MAU ?Rx labetalol but not currently taking and normotensive ?Multiple allergies listed to NSAIDs, ASA not prescribed for this reason ?Routine obstetric precautions reviewed. ?Return in 4 weeks (on 02/10/2022) for [facility] patient, ob visit. ? ?  ? ?

## 2022-01-13 NOTE — Patient Instructions (Signed)
Second Trimester of Pregnancy ?The second trimester of pregnancy is from week 13 through week 27. This is months 4 through 6 of pregnancy. The second trimester is often a time when you feel your best. Your body has adjusted to being pregnant, and you begin to feel better physically. ?During the second trimester: ?Morning sickness has lessened or stopped completely. ?You may have more energy. ?You may have an increase in appetite. ?The second trimester is also a time when the unborn baby (fetus) is growing rapidly. At the end of the sixth month, the fetus may be up to 12 inches long and weigh about 1? pounds. You will likely begin to feel the baby move (quickening) between 16 and 20 weeks of pregnancy. ?Body changes during your second trimester ?Your body continues to go through many changes during your second trimester. The changes vary and generally return to normal after the baby is born. ?Physical changes ?Your weight will continue to increase. You will notice your lower abdomen bulging out. ?You may begin to get stretch marks on your hips, abdomen, and breasts. ?Your breasts will continue to grow and to become tender. ?Dark spots or blotches (chloasma or mask of pregnancy) may develop on your face. ?A dark line from your belly button to the pubic area (linea nigra) may appear. ?You may have changes in your hair. These can include thickening of your hair, rapid growth, and changes in texture. Some people also have hair loss during or after pregnancy, or hair that feels dry or thin. ?Health changes ?You may develop headaches. ?You may have heartburn. ?You may develop constipation. ?You may develop hemorrhoids or swollen, bulging veins (varicose veins). ?Your gums may bleed and may be sensitive to brushing and flossing. ?You may urinate more often because the fetus is pressing on your bladder. ?You may have back pain. This is caused by: ?Weight gain. ?Pregnancy hormones that are relaxing the joints in your  pelvis. ?A shift in weight and the muscles that support your balance. ?Follow these instructions at home: ?Medicines ?Follow your health care provider's instructions regarding medicine use. Specific medicines may be either safe or unsafe to take during pregnancy. Do not take any medicines unless approved by your health care provider. ?Take a prenatal vitamin that contains at least 600 micrograms (mcg) of folic acid. ?Eating and drinking ?Eat a healthy diet that includes fresh fruits and vegetables, whole grains, good sources of protein such as meat, eggs, or tofu, and low-fat dairy products. ?Avoid raw meat and unpasteurized juice, milk, and cheese. These carry germs that can harm you and your baby. ?You may need to take these actions to prevent or treat constipation: ?Drink enough fluid to keep your urine pale yellow. ?Eat foods that are high in fiber, such as beans, whole grains, and fresh fruits and vegetables. ?Limit foods that are high in fat and processed sugars, such as fried or sweet foods. ?Activity ?Exercise only as directed by your health care provider. Most people can continue their usual exercise routine during pregnancy. Try to exercise for 30 minutes at least 5 days a week. Stop exercising if you develop contractions in your uterus. ?Stop exercising if you develop pain or cramping in the lower abdomen or lower back. ?Avoid exercising if it is very hot or humid or if you are at a high altitude. ?Avoid heavy lifting. ?If you choose to, you may have sex unless your health care provider tells you not to. ?Relieving pain and discomfort ?Wear a supportive bra   to prevent discomfort from breast tenderness. ?Take warm sitz baths to soothe any pain or discomfort caused by hemorrhoids. Use hemorrhoid cream if your health care provider approves. ?Rest with your legs raised (elevated) if you have leg cramps or low back pain. ?If you develop varicose veins: ?Wear support hose as told by your health care  provider. ?Elevate your feet for 15 minutes, 3-4 times a day. ?Limit salt in your diet. ?Safety ?Wear your seat belt at all times when driving or riding in a car. ?Talk with your health care provider if someone is verbally or physically abusive to you. ?Lifestyle ?Do not use hot tubs, steam rooms, or saunas. ?Do not douche. Do not use tampons or scented sanitary pads. ?Avoid cat litter boxes and soil used by cats. These carry germs that can cause birth defects in the baby and possibly loss of the fetus by miscarriage or stillbirth. ?Do not use herbal remedies, alcohol, illegal drugs, or medicines that are not approved by your health care provider. Chemicals in these products can harm your baby. ?Do not use any products that contain nicotine or tobacco, such as cigarettes, e-cigarettes, and chewing tobacco. If you need help quitting, ask your health care provider. ?General instructions ?During a routine prenatal visit, your health care provider will do a physical exam and other tests. He or she will also discuss your overall health. Keep all follow-up visits. This is important. ?Ask your health care provider for a referral to a local prenatal education class. ?Ask for help if you have counseling or nutritional needs during pregnancy. Your health care provider can offer advice or refer you to specialists for help with various needs. ?Where to find more information ?American Pregnancy Association: americanpregnancy.org ?American College of Obstetricians and Gynecologists: acog.org/en/Womens%20Health/Pregnancy ?Office on Women's Health: womenshealth.gov/pregnancy ?Contact a health care provider if you have: ?A headache that does not go away when you take medicine. ?Vision changes or you see spots in front of your eyes. ?Mild pelvic cramps, pelvic pressure, or nagging pain in the abdominal area. ?Persistent nausea, vomiting, or diarrhea. ?A bad-smelling vaginal discharge or foul-smelling urine. ?Pain when you  urinate. ?Sudden or extreme swelling of your face, hands, ankles, feet, or legs. ?A fever. ?Get help right away if you: ?Have fluid leaking from your vagina. ?Have spotting or bleeding from your vagina. ?Have severe abdominal cramping or pain. ?Have difficulty breathing. ?Have chest pain. ?Have fainting spells. ?Have not felt your baby move for the time period told by your health care provider. ?Have new or increased pain, swelling, or redness in an arm or leg. ?Summary ?The second trimester of pregnancy is from week 13 through week 27 (months 4 through 6). ?Do not use herbal remedies, alcohol, illegal drugs, or medicines that are not approved by your health care provider. Chemicals in these products can harm your baby. ?Exercise only as directed by your health care provider. Most people can continue their usual exercise routine during pregnancy. ?Keep all follow-up visits. This is important. ?This information is not intended to replace advice given to you by your health care provider. Make sure you discuss any questions you have with your health care provider. ?Document Revised: 03/13/2020 Document Reviewed: 01/18/2020 ?Elsevier Patient Education ? 2022 [employer] ? ?Contraception Choices ?Contraception, also called birth control, refers to methods or devices that prevent pregnancy. ?Hormonal methods ?Contraceptive implant ?A contraceptive implant is a thin, plastic tube that contains a hormone that prevents pregnancy. It is different from an intrauterine device (IUD). It   is inserted into the upper part of the arm by a health care provider. Implants can be effective for up to 3 years. ?Progestin-only injections ?Progestin-only injections are injections of progestin, a synthetic form of the hormone progesterone. They are given every 3 months by a health care provider. ?Birth control pills ?Birth control pills are pills that contain hormones that prevent pregnancy. They must be taken once a day, preferably at the  same time each day. A prescription is needed to use this method of contraception. ?Birth control patch ?The birth control patch contains hormones that prevent pregnancy. It is placed on the skin and must be changed once a week for

## 2022-01-14 ENCOUNTER — Encounter: Payer: Self-pay | Admitting: *Deleted

## 2022-01-14 LAB — CBC/D/PLT+RPR+RH+ABO+RUBIGG...
Antibody Screen: NEGATIVE
Basophils Absolute: 0 10*3/uL (ref 0.0–0.2)
Basos: 1 %
EOS (ABSOLUTE): 0.1 10*3/uL (ref 0.0–0.4)
Eos: 2 %
HCV Ab: NONREACTIVE
HIV Screen 4th Generation wRfx: NONREACTIVE
Hematocrit: 39.2 % (ref 34.0–46.6)
Hemoglobin: 13.4 g/dL (ref 11.1–15.9)
Hepatitis B Surface Ag: NEGATIVE
Immature Grans (Abs): 0 10*3/uL (ref 0.0–0.1)
Immature Granulocytes: 0 %
Lymphocytes Absolute: 2.4 10*3/uL (ref 0.7–3.1)
Lymphs: 31 %
MCH: 29.4 pg (ref 26.6–33.0)
MCHC: 34.2 g/dL (ref 31.5–35.7)
MCV: 86 fL (ref 79–97)
Monocytes Absolute: 0.8 10*3/uL (ref 0.1–0.9)
Monocytes: 10 %
Neutrophils Absolute: 4.4 10*3/uL (ref 1.4–7.0)
Neutrophils: 56 %
Platelets: 292 10*3/uL (ref 150–450)
RBC: 4.56 x10E6/uL (ref 3.77–5.28)
RDW: 13.1 % (ref 11.7–15.4)
RPR Ser Ql: NONREACTIVE
Rh Factor: POSITIVE
Rubella Antibodies, IGG: 4.93 index (ref >0.99)
WBC: 7.8 10*3/uL (ref 3.4–10.8)

## 2022-01-14 LAB — HCV INTERPRETATION

## 2022-01-14 LAB — HEMOGLOBIN A1C
Est. average glucose Bld gHb Est-mCnc: 103 mg/dL
Hgb A1c MFr Bld: 5.2 % (ref 4.8–5.6)

## 2022-01-15 LAB — CULTURE, OB URINE

## 2022-01-15 LAB — URINE CULTURE, OB REFLEX

## 2022-02-02 NOTE — Progress Notes (Signed)
CMA called patient to informed her Rebecca Navarro Genetic screening result will be upload on MyChart that will also have baby's gender on it.  ?Patient is aware. ? ?Felecia Shelling, [place] 02/02/2022 ?

## 2022-02-03 ENCOUNTER — Encounter: Payer: Self-pay | Admitting: Family Medicine

## 2022-02-09 ENCOUNTER — Encounter: Payer: Self-pay | Admitting: Family Medicine

## 2022-02-09 ENCOUNTER — Ambulatory Visit (INDEPENDENT_AMBULATORY_CARE_PROVIDER_SITE_OTHER): Payer: Medicaid Other | Admitting: Family Medicine

## 2022-02-09 ENCOUNTER — Other Ambulatory Visit (HOSPITAL_COMMUNITY)
Admission: RE | Admit: 2022-02-09 | Discharge: 2022-02-09 | Disposition: A | Discharge status: Home / Self Care | Payer: Medicaid Other | Source: Ambulatory Visit | Attending: Family Medicine | Admitting: Family Medicine

## 2022-02-09 VITALS — BP 134/74 | HR 78 | Wt 188.7 lb

## 2022-02-09 DIAGNOSIS — Z3480 Encounter for supervision of other normal pregnancy, unspecified trimester: Secondary | ICD-10-CM

## 2022-02-09 DIAGNOSIS — O99210 Obesity complicating pregnancy, unspecified trimester: Secondary | ICD-10-CM

## 2022-02-09 DIAGNOSIS — O10911 Unspecified pre-existing hypertension complicating pregnancy, first trimester: Secondary | ICD-10-CM

## 2022-02-09 DIAGNOSIS — Z3A15 15 weeks gestation of pregnancy: Secondary | ICD-10-CM

## 2022-02-09 DIAGNOSIS — F332 Major depressive disorder, recurrent severe without psychotic features: Secondary | ICD-10-CM

## 2022-02-09 NOTE — Progress Notes (Signed)
? ?  Subjective:  ?Rebecca Navarro is a 21 y.o. G1P0 at [redacted]w[redacted]d being seen today for ongoing prenatal care.  She is currently monitored for the following issues for this low-risk pregnancy and has Severe episode of recurrent major depressive disorder, without psychotic features (HCC); Anxiety disorder of adolescence; Self-harm; Degenerative disc disease, lumbar; Chronic bilateral low back pain without sciatica; Encounter for supervision of normal first pregnancy in second trimester; Chronic hypertension in obstetric context in first trimester; and Obesity in pregnancy on their problem list. ? ?Patient reports no complaints.  Contractions: Not present. Vag. Bleeding: None.  Movement: Absent. Denies leaking of fluid.  ? ?The following portions of the patient's history were reviewed and updated as appropriate: allergies, current medications, past family history, past medical history, past social history, past surgical history and problem list. Problem list updated. ? ?Objective:  ? ?Vitals:  ? 02/09/22 0843  ?BP: 134/74  ?Pulse: 78  ?Weight: 188 lb 11.2 oz (85.6 kg)  ? ? ?Fetal Status: Fetal Heart Rate (bpm): 146   Movement: Absent    ? ?General:  Alert, oriented and cooperative. Patient is in no acute distress.  ?Skin: Skin is warm and dry. No rash noted.   ?Cardiovascular: Normal heart rate noted  ?Respiratory: Normal respiratory effort, no problems with respiration noted  ?Abdomen: Soft, gravid, appropriate for gestational age. Pain/Pressure: Present     ?Pelvic: Vag. Bleeding: None     ?Cervical exam deferred        ?Extremities: Normal range of motion.     ?Mental Status: Normal mood and affect. Normal behavior. Normal judgment and thought content.  ? ?Urinalysis:     ? ?Assessment and Plan:  ?Pregnancy: G1P0 at [redacted]w[redacted]d ? ?1. Supervision of other normal pregnancy, antepartum ?BP and FHR normal ?Some discharge, self swab collected ? ?2. Chronic hypertension in obstetric context in first trimester ?Normotensive,  no meds ?Not on ASA due to allergy ? ?3. Obesity in pregnancy ? ? ?Preterm labor symptoms and general obstetric precautions including but not limited to vaginal bleeding, contractions, leaking of fluid and fetal movement were reviewed in detail with the patient. ?Please refer to After Visit Summary for other counseling recommendations.  ?Return in 4 weeks (on 03/09/2022) for Dyad patient, ob visit. ? ? ?Venora Maples, MD ? ?

## 2022-02-09 NOTE — Patient Instructions (Signed)

## 2022-02-10 ENCOUNTER — Encounter: Payer: Self-pay | Admitting: Family Medicine

## 2022-02-10 DIAGNOSIS — A749 Chlamydial infection, unspecified: Secondary | ICD-10-CM | POA: Insufficient documentation

## 2022-02-10 LAB — CERVICOVAGINAL ANCILLARY ONLY
Bacterial Vaginitis (gardnerella): POSITIVE — AB
Candida Glabrata: NEGATIVE
Candida Vaginitis: NEGATIVE
Chlamydia: POSITIVE — AB
Comment: NEGATIVE
Comment: NEGATIVE
Comment: NEGATIVE
Comment: NEGATIVE
Comment: NEGATIVE
Comment: NORMAL
Neisseria Gonorrhea: NEGATIVE
Trichomonas: NEGATIVE

## 2022-02-10 MED ORDER — AZITHROMYCIN 500 MG PO TABS: 1000.0000 mg | ORAL_TABLET | Freq: Every day | ORAL | 0 refills | Status: AC

## 2022-02-10 MED ORDER — METRONIDAZOLE 500 MG PO TABS
500.0000 mg | ORAL_TABLET | Freq: Two times a day (BID) | ORAL | 0 refills | Status: DC
Start: 2022-02-10 — End: 2022-03-10

## 2022-02-11 LAB — AFP, SERUM, OPEN SPINA BIFIDA
AFP MoM: 0.54
AFP Value: 14.3 ng/mL
Gest. Age on Collection Date: 15 weeks
Maternal Age At EDD: 20 yr
OSBR Risk 1 IN: 10000
Test Results:: NEGATIVE
Weight: 188 [lb_av]

## 2022-03-09 ENCOUNTER — Other Ambulatory Visit: Payer: Self-pay | Admitting: *Deleted

## 2022-03-09 ENCOUNTER — Ambulatory Visit: Payer: Medicaid Other | Attending: Family Medicine

## 2022-03-09 ENCOUNTER — Ambulatory Visit: Payer: Medicaid Other | Admitting: *Deleted

## 2022-03-09 VITALS — BP 145/64 | HR 78

## 2022-03-09 DIAGNOSIS — O99212 Obesity complicating pregnancy, second trimester: Secondary | ICD-10-CM | POA: Diagnosis not present

## 2022-03-09 DIAGNOSIS — O10012 Pre-existing essential hypertension complicating pregnancy, second trimester: Secondary | ICD-10-CM | POA: Diagnosis not present

## 2022-03-09 DIAGNOSIS — Z3480 Encounter for supervision of other normal pregnancy, unspecified trimester: Secondary | ICD-10-CM | POA: Diagnosis not present

## 2022-03-09 DIAGNOSIS — Z363 Encounter for antenatal screening for malformations: Secondary | ICD-10-CM | POA: Insufficient documentation

## 2022-03-09 DIAGNOSIS — Z3A19 19 weeks gestation of pregnancy: Secondary | ICD-10-CM | POA: Diagnosis not present

## 2022-03-09 DIAGNOSIS — O10912 Unspecified pre-existing hypertension complicating pregnancy, second trimester: Secondary | ICD-10-CM

## 2022-03-10 ENCOUNTER — Encounter: Payer: Self-pay | Admitting: Family Medicine

## 2022-03-10 ENCOUNTER — Ambulatory Visit (INDEPENDENT_AMBULATORY_CARE_PROVIDER_SITE_OTHER): Payer: Medicaid Other | Admitting: Family Medicine

## 2022-03-10 ENCOUNTER — Other Ambulatory Visit (HOSPITAL_COMMUNITY)
Admission: RE | Admit: 2022-03-10 | Discharge: 2022-03-10 | Disposition: A | Discharge status: Home / Self Care | Payer: Medicaid Other | Source: Ambulatory Visit | Attending: Family Medicine | Admitting: Family Medicine

## 2022-03-10 VITALS — BP 132/84 | HR 87 | Wt 192.3 lb

## 2022-03-10 DIAGNOSIS — O98812 Other maternal infectious and parasitic diseases complicating pregnancy, second trimester: Secondary | ICD-10-CM | POA: Insufficient documentation

## 2022-03-10 DIAGNOSIS — O99210 Obesity complicating pregnancy, unspecified trimester: Secondary | ICD-10-CM

## 2022-03-10 DIAGNOSIS — A749 Chlamydial infection, unspecified: Secondary | ICD-10-CM | POA: Insufficient documentation

## 2022-03-10 DIAGNOSIS — Z3402 Encounter for supervision of normal first pregnancy, second trimester: Secondary | ICD-10-CM

## 2022-03-10 DIAGNOSIS — O10911 Unspecified pre-existing hypertension complicating pregnancy, first trimester: Secondary | ICD-10-CM

## 2022-03-10 NOTE — Patient Instructions (Signed)

## 2022-03-10 NOTE — Progress Notes (Signed)
   Subjective:  Rebecca Navarro is a 21 y.o. G1P0 at [redacted]w[redacted]d being seen today for ongoing prenatal care.  She is currently monitored for the following issues for this high-risk pregnancy and has Severe episode of recurrent major depressive disorder, without psychotic features (HCC); Anxiety disorder of adolescence; Self-harm; Degenerative disc disease, lumbar; Chronic bilateral low back pain without sciatica; Encounter for supervision of normal first pregnancy in second trimester; Chronic hypertension in obstetric context in first trimester; Obesity in pregnancy; and Chlamydia infection affecting pregnancy on their problem list.  Patient reports  feeling more emotional of late .  Contractions: Not present. Vag. Bleeding: None.  Movement: Present. Denies leaking of fluid.   The following portions of the patient's history were reviewed and updated as appropriate: allergies, current medications, past family history, past medical history, past social history, past surgical history and problem list. Problem list updated.  Objective:   Vitals:   03/10/22 1519  BP: 132/84  Pulse: 87  Weight: 192 lb 4.8 oz (87.2 kg)    Fetal Status: Fetal Heart Rate (bpm): 148   Movement: Present     General:  Alert, oriented and cooperative. Patient is in no acute distress.  Skin: Skin is warm and dry. No rash noted.   Cardiovascular: Normal heart rate noted  Respiratory: Normal respiratory effort, no problems with respiration noted  Abdomen: Soft, gravid, appropriate for gestational age. Pain/Pressure: Present     Pelvic: Vag. Bleeding: None     Cervical exam deferred        Extremities: Normal range of motion.     Mental Status: Normal mood and affect. Normal behavior. Normal judgment and thought content.   Urinalysis:      Assessment and Plan:  Pregnancy: G1P0 at [redacted]w[redacted]d  1. Encounter for supervision of normal first pregnancy in second trimester BP and FHR normal AFP negative at last  visit Increased crying spells, OK with referral to BH - [facility] AMB BABYSCRIPTS SCHEDULE OPTIMIZATION - Ambulatory referral to [facility]  2. Chronic hypertension in obstetric context in first trimester Not on any meds No ASA due to allergy Normotensive  3. Obesity in pregnancy   4. Chlamydia infection affecting pregnancy in second trimester TOC today  Preterm labor symptoms and general obstetric precautions including but not limited to vaginal bleeding, contractions, leaking of fluid and fetal movement were reviewed in detail with the patient. Please refer to After Visit Summary for other counseling recommendations.  Return in 4 weeks (on 04/07/2022) for Dyad patient, ob visit.   Venora Maples, MD

## 2022-03-11 LAB — CERVICOVAGINAL ANCILLARY ONLY
Chlamydia: NEGATIVE
Comment: NEGATIVE
Comment: NEGATIVE
Comment: NORMAL
Neisseria Gonorrhea: NEGATIVE
Trichomonas: NEGATIVE

## 2022-03-13 ENCOUNTER — Ambulatory Visit (INDEPENDENT_AMBULATORY_CARE_PROVIDER_SITE_OTHER): Payer: Medicaid Other | Admitting: Clinical

## 2022-03-13 DIAGNOSIS — F4323 Adjustment disorder with mixed anxiety and depressed mood: Secondary | ICD-10-CM

## 2022-03-13 NOTE — Patient Instructions (Addendum)
Center for [facility] [place], [place] [address] [phone] (main office) [phone] [facility] office)  [email or web address]     BRAINSTORMING  Develop a Plan Goals: Provide a way to start conversation about your new life with a baby Assist parents in recognizing and using resources within their reach Help pave the way before birth for an easier period of transition afterwards.  Make a list of the following information to keep in a central location: Full name of Mom and Partner: _____________________________________________ 57 full name and Date of Birth: ___________________________________________ Home Address: ___________________________________________________________ ________________________________________________________________________ Home Phone: ____________________________________________________________ Parents' cell numbers: _____________________________________________________ ________________________________________________________________________ Name and contact info for OB: ______________________________________________ Name and contact info for Pediatrician:________________________________________ Contact info for Lactation Consultants: ________________________________________  REST and SLEEP *You each need at least 4-5 hours of uninterrupted sleep every day. Write specific names and contact information.* How are you going to rest in the postpartum period? While partner's home? When partner returns to work? When you both return to work? Where will your baby sleep? Who is available to help during the day? Evening? Night? Who could move in for a period to help support you? What are some ideas to help you get enough  sleep? __________________________________________________________________________________________________________________________________________________________________________________________________________________________________________ NUTRITIOUS FOOD AND DRINK *Plan for meals before your baby is born so you can have healthy food to eat during the immediate postpartum period.* Who will look after breakfast? Lunch? Dinner? List names and contact information. Brainstorm quick, healthy ideas for each meal. What can you do before baby is born to prepare meals for the postpartum period? How can others help you with meals? Which grocery stores provide online shopping and delivery? Which restaurants offer take-out or delivery options? ______________________________________________________________________________________________________________________________________________________________________________________________________________________________________________________________________________________________________________________________________________________________________________________________________  CARE FOR MOM *It's important that mom is cared for and pampered in the postpartum period. Remember, the most important ways new mothers need care are: sleep, nutrition, gentle exercise, and time off.* Who can come take care of mom during this period? Make a list of people with their contact information. List some activities that make you feel cared for, rested, and energized? Who can make sure you have opportunities to do these things? Does mom have a space of her very own within your home that's just for her? Make a "[facility] [place]" where she can be comfortable, rest, and renew herself  daily. ______________________________________________________________________________________________________________________________________________________________________________________________________________________________________________________________________________________________________________________________________________________________________________________________________    CARE FOR AND FEEDING BABY *Knowledgeable and encouraging people will offer the best support with regard to feeding your baby.* Educate yourself and choose the best feeding option for your baby. Make a list of people who will guide, support, and be a resource for you as your care for and feed your baby. (Friends that have breastfed or are currently breastfeeding, lactation consultants, breastfeeding support groups, etc.) Consider a postpartum doula. (These websites can give you information: dona.org & BuyingShow.es) Seek out local breastfeeding resources like the breastfeeding support group at [employer] or [employer]. ______________________________________________________________________________________________________________________________________________________________________________________________________________________________________________________________________________________________________________________________________________________________________________________________________  Verner Chol AND ERRANDS Who can help with a thorough cleaning before baby is born? Make a list of people who will help with housekeeping and chores, like laundry, light cleaning, dishes, bathrooms, etc. Who can run some errands for you? What can you do to make sure you are stocked with basic supplies before baby is born? Who is going to do the  shopping? ______________________________________________________________________________________________________________________________________________________________________________________________________________________________________________________________________________________________________________________________________________________________________________________________________     Family Adjustment *Nurture yourselves.it helps parents be more loving and allows for better bonding with their child.* What sorts of things do you and  doing together? Which activities help you to connect and strengthen your relationship? Make a list of those things. Make a list of people whom you trust to care for your baby so you can have some time together as a couple. What types of things help partner feel connected to Mom? Make a list. What needs will partner have in order to bond with baby? Other children? Who will care for them when you go into labor and while you are in the hospital? Think about what the needs of your older children might be. Who can help you meet those needs? In what ways are you helping them prepare for bringing baby home? List some specific strategies you have for family adjustment. _______________________________________________________________________________________________________________________________________________________________________________________________________________________________________________________________________________________________________________________________________________  SUPPORT *Someone who can empathize with experiences normalizes your problems and makes them more bearable.* Make a list of other friends, neighbors, and/or co-workers you know with infants (and small children, if applicable) with whom you can connect. Make a list of local or online support groups, mom groups, etc. in which you can be  involved. ______________________________________________________________________________________________________________________________________________________________________________________________________________________________________________________________________________________________________________________________________________________________________________________________________  Childcare Plans Investigate and plan for childcare if mom is returning to work. Talk about mom's concerns about her transition back to work. Talk about partner's concerns regarding this transition.  Mental Health *Your mental health is one of the highest priorities for a pregnant or postpartum mom.* 1 in 5 women experience anxiety and/or depression from the time of conception through the first year after birth. Postpartum Mood Disorders are the #1 complication of pregnancy and childbirth and the suffering experienced by these mothers is not necessary! These illnesses are temporary and respond well to treatment, which often includes self-care, social support, talk therapy, and medication when needed. Women experiencing anxiety and depression often say things like: "I'm supposed to be happy.why do I feel so sad?", "Why can't I snap out of it?", "I'm having thoughts that scare me." There is no need to be embarrassed if you are feeling these symptoms: Overwhelmed, anxious, angry, sad, guilty, irritable, hopeless, exhausted but can't sleep You are NOT alone. You are NOT to blame. With help, you WILL be well. Where can I find help? Medical professionals such as your OB, midwife, gynecologist, family practitioner, primary care provider, pediatrician, or mental health providers; [facility] support groups: Feelings After Birth, Breastfeeding Support Group, Baby and Me Group, and Fit 4 Two exercise classes. You have permission to ask for help. It will confirm your feelings, validate your experiences,  share/learn coping strategies, and gain support and encouragement as you heal. You are important! BRAINSTORM Make a list of local resources, including resources for mom and for partner. Identify support groups. Identify people to call late at night - include names and contact info. Talk with partner about perinatal mood and anxiety disorders. Talk with your OB, midwife, and doula about baby blues and about perinatal mood and anxiety disorders. Talk with your pediatrician about perinatal mood and anxiety disorders.   Support & Sanity Savers   What do you really need?  Basics In preparing for a new baby, many expectant parents spend hours shopping for baby clothes, decorating the nursery, and deciding which car seat to buy. Yet most don't think much about what the reality of parenting a newborn will be like, and what they need to make it through that. So, here is the advice of experienced parents. We know you'll read this, and think "they're exaggerating, I don't really need that." Just trust us on these, OK? Plan for all of   this, and if it turns out you don't need it, come back and teach us how you did it!  Must-Haves (Once baby's survival needs are met, make sure you attend to your own survival needs!) Sleep An average newborn sleeps 16-18 hours per day, over 6-7 sleep periods, rarely more than three hours at a time. It is normal and healthy for a newborn to wake throughout the night... but really hard on parents!! Naps. Prioritize sleep above any responsibilities like: cleaning house, visiting friends, running errands, etc.  Sleep whenever baby sleeps. If you can't nap, at least have restful times when baby eats. The more rest you get, the more patient you will be, the more emotionally stable, and better at solving problems.  Food You may not have realized it would be difficult to eat when you have a newborn. Yet, when we talk to countless new parents, they say things like "it may be 2:00 pm  when I realize I haven't had breakfast yet." Or "every time we sit down to dinner, baby needs to eat, and my food gets cold, so I don't bother to eat it." Finger food. Before your baby is born, stock up with one months' worth of food that: 1) you can eat with one hand while holding a baby, 2) doesn't need to be prepped, 3) is good hot or cold, 4) doesn't spoil when left out for a few hours, and 5) you like to eat. Think about: nuts, dried fruit, Clif bars, pretzels, jerky, gogurt, baby carrots, apples, bananas, crackers, cheez-n-crackers, string cheese, hot pockets or frozen burritos to microwave, garden burgers and breakfast pastries to put in the toaster, yogurt drinks, etc. Restaurant Menus. Make lists of your favorite restaurants & menu items. When family/friends want to help, you can give specific information without much thought. They can either bring you the food or send gift cards for just the right meals. Freezer Meals.  Take some time to make a few meals to put in the freezer ahead of time.  Easy to freeze meals can be anything such as soup, lasagna, chicken pie, or spaghetti sauce. Set up a Meal Schedule.  Ask friends and family to sign up to bring you meals during the first few weeks of being home. (It can be passed around at baby showers!) You have no idea how helpful this will be until you are in the throes of parenting.  www.takethemameal.com is a great website to check out. Emotional Support Know who to call when you're stressed out. Parenting a newborn is very challenging work. There are times when it totally overwhelms your normal coping abilities. EVERY NEW PARENT NEEDS TO HAVE A PLAN FOR WHO TO CALL WHEN THEY JUST CAN'T COPE ANY MORE. (And it has to be someone other than the baby's other parent!) Before your baby is born, come up with at least one person you can call for support - write their phone number down and post it on the refrigerator. Anxiety & Sadness. Baby blues are normal after  pregnancy; however, there are more severe types of anxiety & sadness which can occur and should not be ignored.  They are always treatable, but you have to take the first step by reaching out for help. [facility] offers a "Mom Talk" group which meets every Tuesday from 10 am - 11 am.  This group is for new moms who need support and connection after their babies are born.  Call [phone].  Really, Really Helpful (Plan for them!   Make sure these happen often!!) Physical Support with Taking Care of Yourselves Asking friends and family. Before your baby is born, set up a schedule of people who can come and visit and help out (or ask a friend to schedule for you). Any time someone says "let me know what I can do to help," sign them up for a day. When they get there, their job is not to take care of the baby (that's your job and your joy). Their job is to take care of you!  Postpartum doulas. If you don't have anyone you can call on for support, look into postpartum doulas:  professionals at helping parents with caring for baby, caring for themselves, getting breastfeeding started, and helping with household tasks. www.padanc.org is a helpful website for learning about doulas in our area. Peer Support / Parent Groups Why: One of the greatest ideas for new parents is to be around other new parents. Parent groups give you a chance to share and listen to others who are going through the same season of life, get a sense of what is normal infant development by watching several babies learn and grow, share your stories of triumph and struggles with empathetic ears, and forgive your own mistakes when you realize all parents are learning by trial and error. Where to find: There are many places you can meet other new parents throughout our community.  [facility] offers the following classes for new moms and their little ones:  Baby and Me (Birth to Crawling) and Breastfeeding Support Group. Go to  www.conehealthybaby.com or call [phone] for more information. Time for your Relationship It's easy to get so caught up in meeting baby's immediate needs that it's hard to find time to connect with your partner, and meet the needs of your relationship. It's also easy to forget what "quality time with your partner" actually looks like. If you take your baby on a date, you'd be amazed how much of your couple time is spent feeding the baby, diapering the baby, admiring the baby, and talking about the baby. Dating: Try to take time for just the two of you. Babysitter tip: Sometimes when moms are breastfeeding a newborn, they find it hard to figure out how to schedule outings around baby's unpredictable feeding schedules. Have the babysitter come for a three hour period. When she comes over, if baby has just eaten, you can leave right away, and come back in two hours. If baby hasn't fed recently, you start the date at home. Once baby gets hungry and gets a good feeding in, you can head out for the rest of your date time. Date Nights at Home: If you can't get out, at least set aside one evening a week to prioritize your relationship: whenever baby dozes off or doesn't have any immediate needs, spend a little time focusing on each other. Potential conflicts: The main relationship conflicts that come up for new parents are: issues related to sexuality, financial stresses, a feeling of an unfair division of household tasks, and conflicts in parenting styles. The more you can work on these issues before baby arrives, the better!  Fun and Frills (Don't forget these. and don't feel guilty for indulging in them!) Everyone has something in life that is a fun little treat that they do just for themselves. It may be: reading the morning paper, or going for a daily jog, or having coffee with a friend once a week, or going to a movie on Friday nights,   or fine chocolates, or bubble baths, or curling up with a good  book. Unless you do fun things for yourself every now and then, it's hard to have the energy for fun with your baby. Whatever your "special" treats are, make sure you find a way to continue to indulge in them after your baby is born. These special moments can recharge you, and allow you to return to baby with a new joy   PERINATAL MOOD DISORDERS: MATERNAL MENTAL HEALTH FROM CONCEPTION THROUGH THE POSTPARTUM PERIOD   _________________________________________Emergency and Crisis Resources If you are an imminent risk to self or others, are experiencing intense personal distress, and/or have noticed significant changes in activities of daily living, call:  911 [facility]: [phone]  [address], [place], [place], [address] Mobile Crisis: 877-626-1772 National Suicide Hotline: 988 Or visit the following crisis centers: Local Emergency Departments [facility]: [address], [place]  [phone]. Hours: 8:30AM-5PM. Insurance Accepted: Medicaid, Medicare, and Uninsured.  [facility]:  [address], [place]  Mon-Friday 8am-3pm, [phone]                                                                                  ___________ Non-Crisis Resources To identify specific providers that are covered by your insurance, contact your insurance company or local agencies:  [facility]: 1-800-256-2452 [facility]: 888-581-9988 [employer]: 1-800-939-5911 Postpartum Support International- Warm-line: 1-800-944-4773                                                      __Outpatient Therapy and Medication Management   Providers:  [facility]: [phone] Hours: 9AM-5PM  Insurance Accepted: AARP, Aetna, BCBS, Cigna, [employer], Humana, Medicare  [facility] [phone] Hours: 8AM-5:30PM  nsurance Accepted: All insurances EXCEPT AARP, Aetna,  [employer], and Humana [facility]: [phone] Hours: 8AM-8PM Insurance Accepted: Aetna, BCBS, Cigna, [employer], Medicaid, Medicare, Uninsured [facility]: [phone] [facility]: [phone] Hours: 8:30AM-7PM Insurance Accepted: Aetna, BCBS, Medicaid, Medicare, Tricare, United Healthcare [facility]:  [phone]   Hours:9AM-5PM Insurance Accepted:  Aetna, BCBS, [facility], Medicaid, United Health Care  [facility]: [phone] Hours: 9AM-5:30PM Insurance Accepted: AARP, Aetna, BCBS, Cigna, and Medicaid, Medicare, United Health Care [facility]:  [phone] Hours: 9am-5pm Insurance Accepted: BCBS; they do not accept Medicaid/Medicare [facility]: [phone] Hours: 9am-9pm Insurance Accepted: All major insurance including Medicaid and Medicare [facility]: [phone] Hours: 9AM- 5PM Insurance Accepted: All insurances EXCEPT Medicaid and Medicare. [facility]: [phone]   ____________                                                                       Parenting [place]: [phone] [place]:  [place]: (support for children in the NICU and/or with special needs), [phone]   ___________                                                                 Mental Health Support Groups Mental Health Association: [phone]    _____________                                                                                  Online Resources Postpartum Support International: http://jones-berg.com/  800-944-4PPD 2Moms Supporting Moms:  www.momssupportingmoms.net   /Emotional [employer] and Websites Here are a few free apps meant to help you to help yourself.  To find, try searching on the internet to see if the app is offered on Apple/Android devices. If  your first choice doesn't come up on your device, the good news is that there are many choices! Play around with different apps to see which ones are helpful to you.    Calm This is an app meant to help increase calm feelings. Includes info, strategies, and tools for tracking your feelings.      Calm Harm  This app is meant to help with self-harm. Provides many 5-minute or 15-min coping strategies for doing instead of hurting yourself.       [place] is a problem-solving tool to help deal with emotions and cope with stress you encounter wherever you are.      MindShift This app can help people cope with anxiety. Rather than trying to avoid anxiety, you can make an important shift and face it.      MY3  MY3 features a support system, safety plan and resources with the goal of offering a tool to use in a time of need.       My Life My Voice  This mood journal offers a simple solution for tracking your thoughts, feelings and moods. Animated emoticons can help identify your mood.       Relax Melodies Designed to help with sleep, on this app you can mix sounds and meditations for relaxation.      Smiling Mind Smiling Mind is meditation made easy: it's a simple tool that helps put a smile on your mind.        Stop, Breathe & Think  A friendly, simple guide for people through meditations for mindfulness and compassion.  Stop, Breathe and Think Kids Enter your current feelings and choose a "mission" to help you cope. Offers videos for certain moods instead of just sound recordings.       Team Orange The goal of this tool is to help teens change how they think, act, and react. This app helps you focus on your own good feelings and experiences.      The [place] Box The [place] Box (VHB) contains simple tools to help patients  with coping, relaxation, distraction, and positive thinking.

## 2022-03-13 NOTE — BH Specialist Note (Signed)
Integrated Behavioral Health via Telemedicine Visit  03/13/2022 Bev Drennen [id number]  Number of Integrated Behavioral Health Clinician visits: 1- Initial Visit  Session Start time: [phone]   Session End time: 0950  Total time in minutes: 34   Referring Provider: Merian Capron, MD Patient/Family location: Home [facility] Provider location: Center for [facility] at [facility]  All persons participating in visit: Patient Rebecca Navarro and [facility] Rebecca Navarro   Types of Service: Individual psychotherapy and Video visit  I connected with Rebecca Navarro and/or Rebecca Navarro's  n/a  via  Telephone or Video Enabled Telemedicine Application  (Video is Caregility application) and verified that I am speaking with the correct person using two identifiers. Discussed confidentiality: Yes   I discussed the limitations of telemedicine and the availability of in person appointments.  Discussed there is a possibility of technology failure and discussed alternative modes of communication if that failure occurs.  I discussed that engaging in this telemedicine visit, they consent to the provision of behavioral healthcare and the services will be billed under their insurance.  Patient and/or legal guardian expressed understanding and consented to Telemedicine visit: Yes   Presenting Concerns: Patient and/or family reports the following symptoms/concerns: Crying spells with no known cause, irritability  and ongoing nausea; history of depression and anxiety as a child; pt is sleeping well (becoming slightly more uncomfortable), appetite is good (early pregnancy throwing up 8-9 times/day to only once/daily now); good support at home/work.  Duration of problem: Current pregnancy; Severity of problem: moderate  Patient and/or Family's Strengths/Protective Factors: Social connections, Concrete supports in place (healthy food, safe environments, etc.),  Sense of purpose, and Physical Health (exercise, healthy diet, medication compliance, etc.)  Goals Addressed: Patient will:  Reduce symptoms of: anxiety and depression   Increase knowledge and/or ability of: healthy habits   Demonstrate ability to: Increase motivation to adhere to plan of care and Decrease self-medicating behaviors  Progress towards Goals: Ongoing  Interventions: Interventions utilized:  Psychoeducation and/or Health Education, Link to [employer], and Supportive Reflection Standardized Assessments completed:  PHQ9/GAD7 given in past two weeks  Patient and/or Family Response: Patient agrees with treatment plan.   Assessment: Patient currently experiencing Adjustment disorder with mixed anxious and depressed mood.   Patient may benefit from psychoeducation and brief therapeutic interventions regarding coping with symptoms of depression, anxiety .  Plan: Follow up with behavioral health clinician on : Two months; Call Dirck Butch at [phone], as needed. Behavioral recommendations:  -Continue taking prenatal vitamin daily (with food) -Consider obtaining pregnancy pillow for comfort to improve sleep -Read through Postpartum Planner and information on After Visit Summary as needed -Continue using Preggy Pops for as long as remains helpful to manage nausea -Consider meditation app of choice as needed on work breaks (as needed)  Referral(s): Integrated [employer] (In Clinic)  I discussed the assessment and treatment plan with the patient and/or parent/guardian. They were provided an opportunity to ask questions and all were answered. They agreed with the plan and demonstrated an understanding of the instructions.   They were advised to call back or seek an in-person evaluation if the symptoms worsen or if the condition fails to improve as anticipated.  Valetta Close Rebecca Demario, LCSW     03/10/2022    3:21 PM 01/13/2022    2:54 PM  Depression screen  PHQ 2/9  Decreased Interest 0 0  Down, Depressed, Hopeless 0 0  PHQ - 2 Score 0 0  Altered sleeping 1 1  Tired, decreased energy 1 1  Change in appetite 1 1  Feeling bad or failure about yourself  0 0  Trouble concentrating 0 0  Moving slowly or fidgety/restless 1 0  Suicidal thoughts 0 0  PHQ-9 Score 4 3  Difficult doing work/chores Not difficult at all Not difficult at all      03/10/2022    3:21 PM 01/13/2022    2:54 PM  GAD 7 : Generalized Anxiety Score  Nervous, Anxious, on Edge 0 0  Control/stop worrying 0 0  Worry too much - different things 0 1  Trouble relaxing 0 0  Restless 1 0  Easily annoyed or irritable 2 2  Afraid - awful might happen 0 0  Total GAD 7 Score 3 3  Anxiety Difficulty  Not difficult at all

## 2022-04-06 ENCOUNTER — Ambulatory Visit (INDEPENDENT_AMBULATORY_CARE_PROVIDER_SITE_OTHER): Payer: Medicaid Other | Admitting: Family Medicine

## 2022-04-06 ENCOUNTER — Encounter: Payer: Medicaid Other | Admitting: Family Medicine

## 2022-04-06 VITALS — BP 122/75 | HR 124 | Wt 198.4 lb

## 2022-04-06 DIAGNOSIS — Z3402 Encounter for supervision of normal first pregnancy, second trimester: Secondary | ICD-10-CM

## 2022-04-06 DIAGNOSIS — O10911 Unspecified pre-existing hypertension complicating pregnancy, first trimester: Secondary | ICD-10-CM

## 2022-04-06 DIAGNOSIS — Z3A23 23 weeks gestation of pregnancy: Secondary | ICD-10-CM

## 2022-04-06 DIAGNOSIS — F332 Major depressive disorder, recurrent severe without psychotic features: Secondary | ICD-10-CM

## 2022-04-06 NOTE — Progress Notes (Signed)
   PRENATAL VISIT NOTE  Subjective:  Rebecca Navarro is a 21 y.o. G1P0 at [redacted]w[redacted]d being seen today for ongoing prenatal care.  She is currently monitored for the following issues for this low-risk pregnancy and has Severe episode of recurrent major depressive disorder, without psychotic features (HCC); Anxiety disorder of adolescence; Self-harm; Degenerative disc disease, lumbar; Chronic bilateral low back pain without sciatica; Encounter for supervision of normal first pregnancy in second trimester; Chronic hypertension in obstetric context in first trimester; and Obesity in pregnancy on their problem list.  Patient reports no complaints.  Contractions: Not present. Vag. Bleeding: None.  Movement: Present. Denies leaking of fluid.   The following portions of the patient's history were reviewed and updated as appropriate: allergies, current medications, past family history, past medical history, past social history, past surgical history and problem list.   Objective:   Vitals:   04/06/22 1446  BP: 122/75  Pulse: (!) 124  Weight: 198 lb 6.4 oz (90 kg)    Fetal Status: Fetal Heart Rate (bpm): 155   Movement: Present     General:  Alert, oriented and cooperative. Patient is in no acute distress.  Skin: Skin is warm and dry. No rash noted.   Cardiovascular: Normal heart rate noted  Respiratory: Normal respiratory effort, no problems with respiration noted  Abdomen: Soft, gravid, appropriate for gestational age.  Pain/Pressure: Absent     Pelvic: Cervical exam deferred        Extremities: Normal range of motion.     Mental Status: Normal mood and affect. Normal behavior. Normal judgment and thought content.   Assessment and Plan:  Pregnancy: G1P0 at [redacted]w[redacted]d 1. Encounter for supervision of normal first pregnancy in second trimester Continue routine prenatal care. AFP negative TOC for Chlam done and negative  2. Chronic hypertension in obstetric context in first trimester ? 1 BP  > 140/90 at < 20 weeks Considered addition of ASA--but patient is allergic to NSAIDS   3. Severe episode of recurrent major depressive disorder, without psychotic features (HCC) Discussed mindfulness, meditation and regular exercise.  Preterm labor symptoms and general obstetric precautions including but not limited to vaginal bleeding, contractions, leaking of fluid and fetal movement were reviewed in detail with the patient. Please refer to After Visit Summary for other counseling recommendations.   Return in 4 weeks (on 05/04/2022) for ob visit.  Future Appointments  Date Time Provider Department Center  04/07/2022  8:30 AM [facility] NURSE [facility]  04/07/2022  8:45 AM [facility] US4 [facility]  04/22/2022  2:55 PM Venora Maples, MD [facility]  05/05/2022  8:15 AM Venora Maples, MD [facility]  05/05/2022  8:20 AM [facility] LAB [facility]  05/07/2022  3:45 PM [facility]IN[facility]  05/19/2022  8:35 AM Venora Maples, MD [facility]  06/02/2022  3:15 PM Venora Maples, MD [facility]    Reva Bores, MD

## 2022-04-07 ENCOUNTER — Ambulatory Visit: Payer: Medicaid Other | Admitting: *Deleted

## 2022-04-07 ENCOUNTER — Ambulatory Visit: Payer: Medicaid Other | Attending: Obstetrics

## 2022-04-07 ENCOUNTER — Encounter: Payer: Self-pay | Admitting: *Deleted

## 2022-04-07 ENCOUNTER — Other Ambulatory Visit: Payer: Self-pay | Admitting: *Deleted

## 2022-04-07 VITALS — BP 126/72 | HR 79

## 2022-04-07 DIAGNOSIS — Z3402 Encounter for supervision of normal first pregnancy, second trimester: Secondary | ICD-10-CM

## 2022-04-07 DIAGNOSIS — Z3A23 23 weeks gestation of pregnancy: Secondary | ICD-10-CM | POA: Diagnosis not present

## 2022-04-07 DIAGNOSIS — E669 Obesity, unspecified: Secondary | ICD-10-CM

## 2022-04-07 DIAGNOSIS — Z362 Encounter for other antenatal screening follow-up: Secondary | ICD-10-CM | POA: Diagnosis not present

## 2022-04-07 DIAGNOSIS — O10919 Unspecified pre-existing hypertension complicating pregnancy, unspecified trimester: Secondary | ICD-10-CM

## 2022-04-07 DIAGNOSIS — O10912 Unspecified pre-existing hypertension complicating pregnancy, second trimester: Secondary | ICD-10-CM | POA: Insufficient documentation

## 2022-04-07 DIAGNOSIS — O99212 Obesity complicating pregnancy, second trimester: Secondary | ICD-10-CM | POA: Diagnosis not present

## 2022-04-07 DIAGNOSIS — O10012 Pre-existing essential hypertension complicating pregnancy, second trimester: Secondary | ICD-10-CM | POA: Insufficient documentation

## 2022-04-22 ENCOUNTER — Other Ambulatory Visit: Payer: Self-pay

## 2022-04-22 ENCOUNTER — Encounter: Payer: Self-pay | Admitting: Family Medicine

## 2022-04-22 ENCOUNTER — Ambulatory Visit (INDEPENDENT_AMBULATORY_CARE_PROVIDER_SITE_OTHER): Payer: Medicaid Other | Admitting: Family Medicine

## 2022-04-22 VITALS — BP 116/73 | HR 73 | Wt 196.0 lb

## 2022-04-22 DIAGNOSIS — O10911 Unspecified pre-existing hypertension complicating pregnancy, first trimester: Secondary | ICD-10-CM

## 2022-04-22 DIAGNOSIS — Z3402 Encounter for supervision of normal first pregnancy, second trimester: Secondary | ICD-10-CM

## 2022-04-22 NOTE — Progress Notes (Signed)
   Subjective:  Rebecca Navarro is a 21 y.o. G1P0 at [redacted]w[redacted]d being seen today for ongoing prenatal care.  She is currently monitored for the following issues for this high-risk pregnancy and has Severe episode of recurrent major depressive disorder, without psychotic features (HCC); Anxiety disorder of adolescence; Self-harm; Degenerative disc disease, lumbar; Chronic bilateral low back pain without sciatica; Encounter for supervision of normal first pregnancy in second trimester; Chronic hypertension in obstetric context in first trimester; and Obesity in pregnancy on their problem list.  Patient reports no complaints.  Contractions: Not present. Vag. Bleeding: None.  Movement: Present. Denies leaking of fluid.   The following portions of the patient's history were reviewed and updated as appropriate: allergies, current medications, past family history, past medical history, past social history, past surgical history and problem list. Problem list updated.  Objective:   Vitals:   04/22/22 1527  BP: 116/73  Pulse: 73  Weight: 196 lb (88.9 kg)    Fetal Status: Fetal Heart Rate (bpm): 146   Movement: Present     General:  Alert, oriented and cooperative. Patient is in no acute distress.  Skin: Skin is warm and dry. No rash noted.   Cardiovascular: Normal heart rate noted  Respiratory: Normal respiratory effort, no problems with respiration noted  Abdomen: Soft, gravid, appropriate for gestational age. Pain/Pressure: Present     Pelvic: Vag. Bleeding: None     Cervical exam deferred        Extremities: Normal range of motion.     Mental Status: Normal mood and affect. Normal behavior. Normal judgment and thought content.   Urinalysis:      Assessment and Plan:  Pregnancy: G1P0 at [redacted]w[redacted]d  1. Encounter for supervision of normal first pregnancy in second trimester BP and FHR normal Discussed fasting labs for next visit Questions about circ, can be done in clinic if desired  2.  Chronic hypertension in obstetric context in first trimester No meds, normotensive Not on ASA due to allergy  Preterm labor symptoms and general obstetric precautions including but not limited to vaginal bleeding, contractions, leaking of fluid and fetal movement were reviewed in detail with the patient. Please refer to After Visit Summary for other counseling recommendations.  Return in about 2 weeks (around 05/06/2022) for Dyad patient, ob visit, 28 wk labs.   Venora Maples, MD

## 2022-04-23 NOTE — BH Specialist Note (Signed)
Integrated Behavioral Health via Telemedicine Visit  05/07/2022 Rebecca Navarro [id number]  Number of Integrated Behavioral Health Clinician visits: 2- Second Visit  Session Start time: 1551   Session End time: 1608  Total time in minutes: 17   Referring Provider: Merian Capron, MD Patient/Family location: [facility] Provider location: [facility]  All persons participating in visit: Patient Rebecca Navarro and Rebecca Navarro   Types of Service: Individual psychotherapy and Video visit  I connected with Rebecca Navarro and/or Rebecca Navarro's  n/a  via  Telephone or Video Enabled Telemedicine Application  (Video is Caregility application) and verified that I am speaking with the correct person using two identifiers. Discussed confidentiality: Yes   I discussed the limitations of telemedicine and the availability of in person appointments.  Discussed there is a possibility of technology failure and discussed alternative modes of communication if that failure occurs.  I discussed that engaging in this telemedicine visit, they consent to the provision of behavioral healthcare and the services will be billed under their insurance.  Patient and/or legal guardian expressed understanding and consented to Telemedicine visit: Yes   Presenting Concerns: Patient and/or family reports the following symptoms/concerns: Uncertainty about childbirth class start date; feels mood is improving with nausea subsiding and an increase in energy; still somewhat uncomfortable at night, but manageable.  Duration of problem: current pregnancy; Severity of problem: mild  Patient and/or Family's Strengths/Protective Factors: Social connections, Concrete supports in place (healthy food, safe environments, etc.), Sense of purpose, and Physical Health (exercise, healthy diet, medication compliance, etc.)  Goals  Addressed: Patient will:  Maintain reduction of symptoms of: anxiety and depression   Progress towards Goals: Ongoing  Interventions: Interventions utilized:  Supportive Reflection Standardized Assessments completed: Not Needed  Patient and/or Family Response: Patient agrees with treatment plan.  Assessment: Patient currently experiencing Adjustment disorder with mixed anxious and depressed mood.   Patient may benefit from therapeutic intervention today.  Plan: Follow up with behavioral health clinician on : Call Rebecca Navarro at [phone], as needed. Behavioral recommendations:  -Continue taking prenatal vitamin as prescribed -Continue prioritizing healthy self-care remainder of pregnancy  -Consider re-registering for childbirth class of choice at [email or web address]  Referral(s): Integrated [employer] (In Clinic)  I discussed the assessment and treatment plan with the patient and/or parent/guardian. They were provided an opportunity to ask questions and all were answered. They agreed with the plan and demonstrated an understanding of the instructions.   They were advised to call back or seek an in-person evaluation if the symptoms worsen or if the condition fails to improve as anticipated.  Rebecca Close Linnie Mcglocklin, LCSW     05/05/2022    8:48 AM 03/10/2022    3:21 PM 01/13/2022    2:54 PM  Depression screen PHQ 2/9  Decreased Interest 1 0 0  Down, Depressed, Hopeless 0 0 0  PHQ - 2 Score 1 0 0  Altered sleeping 2 1 1   Tired, decreased energy 1 1 1   Change in appetite 0 1 1  Feeling bad or failure about yourself  0 0 0  Trouble concentrating 0 0 0  Moving slowly or fidgety/restless 1 1 0  Suicidal thoughts 0 0 0  PHQ-9 Score 5 4 3   Difficult doing work/chores Not difficult at all Not difficult at all Not difficult at all      05/05/2022    8:48 AM 03/10/2022    3:21 PM 01/13/2022  2:54 PM  GAD 7 : Generalized Anxiety Score  Nervous, Anxious, on Edge 0 0 0   Control/stop worrying 0 0 0  Worry too much - different things 0 0 1  Trouble relaxing 2 0 0  Restless 2 1 0  Easily annoyed or irritable 1 2 2   Afraid - awful might happen 0 0 0  Total GAD 7 Score 5 3 3   Anxiety Difficulty   Not difficult at all

## 2022-04-28 ENCOUNTER — Other Ambulatory Visit: Payer: Self-pay

## 2022-04-28 DIAGNOSIS — Z3402 Encounter for supervision of normal first pregnancy, second trimester: Secondary | ICD-10-CM

## 2022-05-04 ENCOUNTER — Telehealth: Payer: Self-pay | Admitting: General Practice

## 2022-05-04 NOTE — Telephone Encounter (Signed)
Patient called into office reporting bloody discharge coming from nipples. Patient states that she has been leaking colostrum so she occasionally looks in her bra but this time she noticed it was bloody. Patient denies stimulation to nipples, recent trauma or piercings. Recommended she discuss it tomorrow at her appt with Dr Crissie Reese. Also reminded patient of 2 hr gtt appt tomorrow and instructions. Patient verbalized understanding.

## 2022-05-05 ENCOUNTER — Ambulatory Visit (INDEPENDENT_AMBULATORY_CARE_PROVIDER_SITE_OTHER): Payer: Medicaid Other | Admitting: Family Medicine

## 2022-05-05 ENCOUNTER — Other Ambulatory Visit: Payer: Medicaid Other

## 2022-05-05 ENCOUNTER — Encounter: Payer: Self-pay | Admitting: Family Medicine

## 2022-05-05 ENCOUNTER — Other Ambulatory Visit: Payer: Self-pay

## 2022-05-05 VITALS — BP 104/60 | HR 79 | Wt 199.4 lb

## 2022-05-05 DIAGNOSIS — Z23 Encounter for immunization: Secondary | ICD-10-CM | POA: Diagnosis not present

## 2022-05-05 DIAGNOSIS — Z3402 Encounter for supervision of normal first pregnancy, second trimester: Secondary | ICD-10-CM

## 2022-05-05 DIAGNOSIS — O10911 Unspecified pre-existing hypertension complicating pregnancy, first trimester: Secondary | ICD-10-CM

## 2022-05-05 NOTE — Patient Instructions (Signed)

## 2022-05-05 NOTE — Progress Notes (Signed)
   Subjective:  Rebecca Navarro is a 21 y.o. G1P0 at [redacted]w[redacted]d being seen today for ongoing prenatal care.  She is currently monitored for the following issues for this high-risk pregnancy and has Severe episode of recurrent major depressive disorder, without psychotic features (HCC); Anxiety disorder of adolescence; Self-harm; Degenerative disc disease, lumbar; Chronic bilateral low back pain without sciatica; Encounter for supervision of normal first pregnancy in second trimester; Chronic hypertension in obstetric context in first trimester; and Obesity in pregnancy on their problem list.  Patient reports no complaints.  Contractions: Irritability. Vag. Bleeding: None.  Movement: Present. Denies leaking of fluid.   The following portions of the patient's history were reviewed and updated as appropriate: allergies, current medications, past family history, past medical history, past social history, past surgical history and problem list. Problem list updated.  Objective:   Vitals:   05/05/22 0831  BP: 104/60  Pulse: 79  Weight: 199 lb 6.4 oz (90.4 kg)    Fetal Status: Fetal Heart Rate (bpm): 140   Movement: Present     General:  Alert, oriented and cooperative. Patient is in no acute distress.  Skin: Skin is warm and dry. No rash noted.   Cardiovascular: Normal heart rate noted  Respiratory: Normal respiratory effort, no problems with respiration noted  Abdomen: Soft, gravid, appropriate for gestational age. Pain/Pressure: Absent     Pelvic: Vag. Bleeding: None     Cervical exam deferred        Extremities: Normal range of motion.     Mental Status: Normal mood and affect. Normal behavior. Normal judgment and thought content.   Urinalysis:      Assessment and Plan:  Pregnancy: G1P0 at [redacted]w[redacted]d  1. Encounter for supervision of normal first pregnancy in second trimester BP and FHR normal 28 week labs today TDAP given Has rash on inner thighs, not itchy or painful, no new  products. Looks like contact dermatitis, recommend OTC hydrocortisone cream, monitor Has some nipple discharge, occasionally pink. Discussed with lactation consultant, not uncommon at this gestational age, reassured patient Interested in discussing pain management options for labor, reviewed epidural, nitrous, IV pain meds, hydrotherapy  2. Chronic hypertension in obstetric context in first trimester Normotensive, no meds  Preterm labor symptoms and general obstetric precautions including but not limited to vaginal bleeding, contractions, leaking of fluid and fetal movement were reviewed in detail with the patient. Please refer to After Visit Summary for other counseling recommendations.  Return in 2 weeks (on 05/19/2022) for Dyad patient, ob visit.   Venora Maples, MD

## 2022-05-06 ENCOUNTER — Encounter: Payer: Self-pay | Admitting: Family Medicine

## 2022-05-06 LAB — CBC
Hematocrit: 36.1 % (ref 34.0–46.6)
Hemoglobin: 12.2 g/dL (ref 11.1–15.9)
MCH: 30.5 pg (ref 26.6–33.0)
MCHC: 33.8 g/dL (ref 31.5–35.7)
MCV: 90 fL (ref 79–97)
Platelets: 256 10*3/uL (ref 150–450)
RBC: 4 x10E6/uL (ref 3.77–5.28)
RDW: 11.8 % (ref 11.7–15.4)
WBC: 8.6 10*3/uL (ref 3.4–10.8)

## 2022-05-06 LAB — GLUCOSE TOLERANCE, 2 HOURS W/ 1HR
Glucose, 1 hour: 79 mg/dL (ref 70–179)
Glucose, 2 hour: 85 mg/dL (ref 70–152)
Glucose, Fasting: 75 mg/dL (ref 70–91)

## 2022-05-06 LAB — RPR: RPR Ser Ql: NONREACTIVE

## 2022-05-06 LAB — HIV ANTIBODY (ROUTINE TESTING W REFLEX): HIV Screen 4th Generation wRfx: NONREACTIVE

## 2022-05-07 ENCOUNTER — Ambulatory Visit: Payer: Medicaid Other | Admitting: Clinical

## 2022-05-07 DIAGNOSIS — F4323 Adjustment disorder with mixed anxiety and depressed mood: Secondary | ICD-10-CM

## 2022-05-07 NOTE — Patient Instructions (Signed)
[facility] [address] , [place] [address] [phone] (main office) [phone] (Clive Parcel's office)  [email or web address]   

## 2022-05-08 ENCOUNTER — Encounter: Payer: Self-pay | Admitting: Family Medicine

## 2022-05-12 ENCOUNTER — Ambulatory Visit: Payer: Medicaid Other | Admitting: *Deleted

## 2022-05-12 ENCOUNTER — Other Ambulatory Visit: Payer: Self-pay | Admitting: Family Medicine

## 2022-05-12 ENCOUNTER — Ambulatory Visit: Payer: Medicaid Other | Attending: Obstetrics and Gynecology

## 2022-05-12 VITALS — BP 128/71 | HR 71

## 2022-05-12 DIAGNOSIS — Z3402 Encounter for supervision of normal first pregnancy, second trimester: Secondary | ICD-10-CM

## 2022-05-12 DIAGNOSIS — O99213 Obesity complicating pregnancy, third trimester: Secondary | ICD-10-CM | POA: Diagnosis not present

## 2022-05-12 DIAGNOSIS — R21 Rash and other nonspecific skin eruption: Secondary | ICD-10-CM

## 2022-05-12 DIAGNOSIS — E669 Obesity, unspecified: Secondary | ICD-10-CM

## 2022-05-12 DIAGNOSIS — O10013 Pre-existing essential hypertension complicating pregnancy, third trimester: Secondary | ICD-10-CM | POA: Diagnosis present

## 2022-05-12 DIAGNOSIS — Z3A28 28 weeks gestation of pregnancy: Secondary | ICD-10-CM | POA: Diagnosis not present

## 2022-05-12 DIAGNOSIS — Z362 Encounter for other antenatal screening follow-up: Secondary | ICD-10-CM | POA: Insufficient documentation

## 2022-05-12 DIAGNOSIS — O10919 Unspecified pre-existing hypertension complicating pregnancy, unspecified trimester: Secondary | ICD-10-CM | POA: Insufficient documentation

## 2022-05-12 MED ORDER — TRIAMCINOLONE ACETONIDE 0.5 % EX OINT: 1.0000 | TOPICAL_OINTMENT | Freq: Two times a day (BID) | CUTANEOUS | 0 refills | Status: DC

## 2022-05-14 ENCOUNTER — Other Ambulatory Visit: Payer: Self-pay | Admitting: *Deleted

## 2022-05-14 DIAGNOSIS — O10913 Unspecified pre-existing hypertension complicating pregnancy, third trimester: Secondary | ICD-10-CM

## 2022-05-14 DIAGNOSIS — O99213 Obesity complicating pregnancy, third trimester: Secondary | ICD-10-CM

## 2022-05-19 ENCOUNTER — Encounter: Payer: Self-pay | Admitting: Family Medicine

## 2022-05-19 ENCOUNTER — Ambulatory Visit (INDEPENDENT_AMBULATORY_CARE_PROVIDER_SITE_OTHER): Payer: Medicaid Other | Admitting: Family Medicine

## 2022-05-19 ENCOUNTER — Other Ambulatory Visit: Payer: Self-pay

## 2022-05-19 VITALS — BP 115/74 | HR 88 | Wt 198.9 lb

## 2022-05-19 DIAGNOSIS — O10911 Unspecified pre-existing hypertension complicating pregnancy, first trimester: Secondary | ICD-10-CM

## 2022-05-19 DIAGNOSIS — Z3402 Encounter for supervision of normal first pregnancy, second trimester: Secondary | ICD-10-CM

## 2022-05-19 DIAGNOSIS — F419 Anxiety disorder, unspecified: Secondary | ICD-10-CM

## 2022-05-19 DIAGNOSIS — F32A Depression, unspecified: Secondary | ICD-10-CM

## 2022-05-19 DIAGNOSIS — R21 Rash and other nonspecific skin eruption: Secondary | ICD-10-CM | POA: Insufficient documentation

## 2022-05-19 NOTE — Progress Notes (Signed)
   Subjective:  Rebecca Navarro is a 21 y.o. G1P0 at [redacted]w[redacted]d being seen today for ongoing prenatal care.  She is currently monitored for the following issues for this high-risk pregnancy and has Severe episode of recurrent major depressive disorder, without psychotic features (HCC); Anxiety disorder of adolescence; Self-harm; Degenerative disc disease, lumbar; Chronic bilateral low back pain without sciatica; Encounter for supervision of normal first pregnancy in second trimester; Chronic hypertension in obstetric context in first trimester; Obesity in pregnancy; Anxiety and depression; and Rash on their problem list.  Patient reports no complaints.  Contractions: Not present. Vag. Bleeding: None.  Movement: Present. Denies leaking of fluid.   The following portions of the patient's history were reviewed and updated as appropriate: allergies, current medications, past family history, past medical history, past social history, past surgical history and problem list. Problem list updated.  Objective:   Vitals:   05/19/22 0848  BP: 115/74  Pulse: 88  Weight: 198 lb 14.4 oz (90.2 kg)    Fetal Status: Fetal Heart Rate (bpm): 138   Movement: Present     General:  Alert, oriented and cooperative. Patient is in no acute distress.  Skin: Skin is warm and dry. No rash noted.   Cardiovascular: Normal heart rate noted  Respiratory: Normal respiratory effort, no problems with respiration noted  Abdomen: Soft, gravid, appropriate for gestational age. Pain/Pressure: Absent     Pelvic: Vag. Bleeding: None     Cervical exam deferred        Extremities: Normal range of motion.     Mental Status: Normal mood and affect. Normal behavior. Normal judgment and thought content.   Urinalysis:      Assessment and Plan:  Pregnancy: G1P0 at [redacted]w[redacted]d  1. Encounter for supervision of normal first pregnancy in second trimester BP and FHR normal  2. Chronic hypertension in obstetric context in first  trimester Normotensive no meds Not on ASA, has anaphylactic reaction  3. Anxiety and depression Has seen Rebecca Navarro, feels she is doing well  4. Rash Improving with kenalog  Preterm labor symptoms and general obstetric precautions including but not limited to vaginal bleeding, contractions, leaking of fluid and fetal movement were reviewed in detail with the patient. Please refer to After Visit Summary for other counseling recommendations.  Return in 2 weeks (on 06/02/2022) for Dyad patient, ob visit.   Venora Maples, MD

## 2022-05-19 NOTE — Patient Instructions (Signed)

## 2022-06-02 ENCOUNTER — Other Ambulatory Visit: Payer: Self-pay

## 2022-06-02 ENCOUNTER — Encounter: Payer: Self-pay | Admitting: Family Medicine

## 2022-06-02 ENCOUNTER — Ambulatory Visit (INDEPENDENT_AMBULATORY_CARE_PROVIDER_SITE_OTHER): Payer: Medicaid Other | Admitting: Family Medicine

## 2022-06-02 VITALS — BP 123/72 | HR 89 | Wt 203.4 lb

## 2022-06-02 DIAGNOSIS — F419 Anxiety disorder, unspecified: Secondary | ICD-10-CM

## 2022-06-02 DIAGNOSIS — F32A Depression, unspecified: Secondary | ICD-10-CM

## 2022-06-02 DIAGNOSIS — Z3402 Encounter for supervision of normal first pregnancy, second trimester: Secondary | ICD-10-CM

## 2022-06-02 DIAGNOSIS — O10911 Unspecified pre-existing hypertension complicating pregnancy, first trimester: Secondary | ICD-10-CM

## 2022-06-02 DIAGNOSIS — R21 Rash and other nonspecific skin eruption: Secondary | ICD-10-CM

## 2022-06-02 NOTE — Patient Instructions (Signed)

## 2022-06-02 NOTE — Progress Notes (Signed)
   Subjective:  Rebecca Navarro is a 21 y.o. G1P0 at [redacted]w[redacted]d being seen today for ongoing prenatal care.  She is currently monitored for the following issues for this high-risk pregnancy and has Severe episode of recurrent major depressive disorder, without psychotic features (HCC); Anxiety disorder of adolescence; Self-harm; Degenerative disc disease, lumbar; Chronic bilateral low back pain without sciatica; Encounter for supervision of normal first pregnancy in second trimester; Chronic hypertension in obstetric context in first trimester; Obesity in pregnancy; Anxiety and depression; and Rash on their problem list.  Patient reports no complaints.  Contractions: Irritability. Vag. Bleeding: None.  Movement: Present. Denies leaking of fluid.   The following portions of the patient's history were reviewed and updated as appropriate: allergies, current medications, past family history, past medical history, past social history, past surgical history and problem list. Problem list updated.  Objective:   Vitals:   06/02/22 1549  BP: 123/72  Pulse: 89  Weight: 203 lb 6.4 oz (92.3 kg)    Fetal Status: Fetal Heart Rate (bpm): 141   Movement: Present     General:  Alert, oriented and cooperative. Patient is in no acute distress.  Skin: Skin is warm and dry. No rash noted.   Cardiovascular: Normal heart rate noted  Respiratory: Normal respiratory effort, no problems with respiration noted  Abdomen: Soft, gravid, appropriate for gestational age. Pain/Pressure: Absent     Pelvic: Vag. Bleeding: None     Cervical exam deferred        Extremities: Normal range of motion.     Mental Status: Normal mood and affect. Normal behavior. Normal judgment and thought content.   Urinalysis:      Assessment and Plan:  Pregnancy: G1P0 at [redacted]w[redacted]d  1. Encounter for supervision of normal first pregnancy in second trimester BP and FHR normal  2. Chronic hypertension in obstetric context in first  trimester Normotensive no meds Not on ASA, has anaphylactic reaction   3. Anxiety and depression Has seen Asher Muir Having a hard week, recently lost her great grandmother who raised her Lost her grandfather a few months ago Has funeral on Saturday and her baby shower on Sunday, going to go ahead with it because she thinks that's what her Laney [place] wants Reports she's just trying to keep it together to not stress out the baby Offered expedited appt with Asher Muir but declines right now, feels like she has good family support in place Discussed that it's perfectly fine to be sad and emotional about such an important person in her life and it will not hurt the baby Emphasized we are here for her if she needs more resources   4. Rash Not addressed at this visit, previously rx kenalog and had been improving   Preterm labor symptoms and general obstetric precautions including but not limited to vaginal bleeding, contractions, leaking of fluid and fetal movement were reviewed in detail with the patient. Please refer to After Visit Summary for other counseling recommendations.  Return in 2 weeks (on 06/16/2022) for Dyad patient, ob visit.   Venora Maples, MD

## 2022-06-10 ENCOUNTER — Ambulatory Visit: Payer: Medicaid Other | Admitting: *Deleted

## 2022-06-10 ENCOUNTER — Ambulatory Visit: Payer: Medicaid Other | Attending: Obstetrics

## 2022-06-10 VITALS — BP 135/64 | HR 83

## 2022-06-10 DIAGNOSIS — O99213 Obesity complicating pregnancy, third trimester: Secondary | ICD-10-CM | POA: Diagnosis not present

## 2022-06-10 DIAGNOSIS — O10013 Pre-existing essential hypertension complicating pregnancy, third trimester: Secondary | ICD-10-CM | POA: Diagnosis not present

## 2022-06-10 DIAGNOSIS — Z3402 Encounter for supervision of normal first pregnancy, second trimester: Secondary | ICD-10-CM

## 2022-06-10 DIAGNOSIS — Z3A32 32 weeks gestation of pregnancy: Secondary | ICD-10-CM | POA: Insufficient documentation

## 2022-06-10 DIAGNOSIS — E669 Obesity, unspecified: Secondary | ICD-10-CM

## 2022-06-10 DIAGNOSIS — O10913 Unspecified pre-existing hypertension complicating pregnancy, third trimester: Secondary | ICD-10-CM | POA: Insufficient documentation

## 2022-06-11 ENCOUNTER — Other Ambulatory Visit: Payer: Self-pay | Admitting: *Deleted

## 2022-06-11 DIAGNOSIS — O10913 Unspecified pre-existing hypertension complicating pregnancy, third trimester: Secondary | ICD-10-CM

## 2022-06-11 DIAGNOSIS — R638 Other symptoms and signs concerning food and fluid intake: Secondary | ICD-10-CM

## 2022-06-16 ENCOUNTER — Ambulatory Visit (INDEPENDENT_AMBULATORY_CARE_PROVIDER_SITE_OTHER): Payer: Medicaid Other | Admitting: Family Medicine

## 2022-06-16 ENCOUNTER — Encounter: Payer: Self-pay | Admitting: Family Medicine

## 2022-06-16 ENCOUNTER — Other Ambulatory Visit: Payer: Self-pay

## 2022-06-16 VITALS — BP 127/76 | HR 89 | Wt 207.5 lb

## 2022-06-16 DIAGNOSIS — F419 Anxiety disorder, unspecified: Secondary | ICD-10-CM

## 2022-06-16 DIAGNOSIS — O10911 Unspecified pre-existing hypertension complicating pregnancy, first trimester: Secondary | ICD-10-CM

## 2022-06-16 DIAGNOSIS — R21 Rash and other nonspecific skin eruption: Secondary | ICD-10-CM

## 2022-06-16 DIAGNOSIS — F32A Depression, unspecified: Secondary | ICD-10-CM

## 2022-06-16 DIAGNOSIS — Z3402 Encounter for supervision of normal first pregnancy, second trimester: Secondary | ICD-10-CM

## 2022-06-16 NOTE — Progress Notes (Signed)
   Subjective:  Rebecca Navarro is a 21 y.o. G1P0 at [redacted]w[redacted]d being seen today for ongoing prenatal care.  She is currently monitored for the following issues for this high-risk pregnancy and has Severe episode of recurrent major depressive disorder, without psychotic features (HCC); Anxiety disorder of adolescence; Self-harm; Degenerative disc disease, lumbar; Chronic bilateral low back pain without sciatica; Encounter for supervision of normal first pregnancy in second trimester; Chronic hypertension in obstetric context in first trimester; Obesity in pregnancy; Anxiety and depression; and Rash on their problem list.  Patient reports no complaints.  Contractions: Irritability. Vag. Bleeding: None.  Movement: Present. Denies leaking of fluid.   The following portions of the patient's history were reviewed and updated as appropriate: allergies, current medications, past family history, past medical history, past social history, past surgical history and problem list. Problem list updated.  Objective:   Vitals:   06/16/22 1602  BP: 127/76  Pulse: 89  Weight: 207 lb 8 oz (94.1 kg)    Fetal Status: Fetal Heart Rate (bpm): 135   Movement: Present     General:  Alert, oriented and cooperative. Patient is in no acute distress.  Skin: Skin is warm and dry. No rash noted.   Cardiovascular: Normal heart rate noted  Respiratory: Normal respiratory effort, no problems with respiration noted  Abdomen: Soft, gravid, appropriate for gestational age. Pain/Pressure: Absent     Pelvic: Vag. Bleeding: None     Cervical exam deferred        Extremities: Normal range of motion.     Mental Status: Normal mood and affect. Normal behavior. Normal judgment and thought content.   Urinalysis:      Assessment and Plan:  Pregnancy: G1P0 at [redacted]w[redacted]d  1. Encounter for supervision of normal first pregnancy in second trimester BP and FHR normal Interested in RSV vaccine, given how new it is we do not have it  in stock and I'm not sure how to obtain it just yet, will ask my colleagues and reach out to pharmacy as she has a small window to get it in Interested in waterbirth, given information, sees Dr. Alvester Morin next visit   2. Chronic hypertension in obstetric context in first trimester Normotensive no meds Not on ASA, has anaphylactic reaction   3. Anxiety and depression Struggling last visit due to loss of family member Still sad but doing better   4. Rash Reports it has resolved  Preterm labor symptoms and general obstetric precautions including but not limited to vaginal bleeding, contractions, leaking of fluid and fetal movement were reviewed in detail with the patient. Please refer to After Visit Summary for other counseling recommendations.  Return in 2 weeks (on 06/30/2022) for Dyad patient, ob visit.   Venora Maples, MD

## 2022-06-16 NOTE — Patient Instructions (Addendum)
Considering Waterbirth? Guide for patients at [facility] ([facility]) Why consider waterbirth? Gentle birth for babies  Less pain medicine used in labor  May allow for passive descent/less pushing  May reduce perineal tears  More mobility and instinctive maternal position changes  Increased maternal relaxation   Is waterbirth safe? What are the risks of infection, drowning or other complications? Infection:  Very low risk (3.7 % for tub vs 4.8% for bed)  7 in 8000 waterbirths with documented infection  Poorly cleaned equipment most common cause  Slightly lower group B strep transmission rate  Drowning  Maternal:  Very low risk  Related to seizures or fainting  Newborn:  Very low risk. No evidence of increased risk of respiratory problems in multiple large studies  Physiological protection from breathing under water  Avoid underwater birth if there are any fetal complications  Once baby's head is out of the water, keep it out.  Birth complication  Some reports of cord trauma, but risk decreased by bringing baby to surface gradually  No evidence of increased risk of shoulder dystocia. Mothers can usually change positions faster in water than in a bed, possibly aiding the maneuvers to free the shoulder.   There are 2 things you MUST do to have a waterbirth with [facility]: Attend a waterbirth class at [facility] [place]   3rd Wednesday of every month from 7-9 pm (virtual during COVID) Free Register online at [email or web address] or [email or web address] or by calling [phone] Bring us the certificate from the class to your prenatal appointment or send via MyChart Meet with a midwife at 36 weeks* to see if you can still plan a waterbirth and to sign the consent.   *We also recommend that you schedule as many of your prenatal visits with a midwife as possible.    Helpful information: You may want to bring a bathing suit top to the hospital  to wear during labor but this is optional.  All other supplies are provided by the hospital. Please arrive at the hospital with signs of active labor, and do not wait at home until late in labor. It takes 45 min- 1 hour for fetal monitoring, and check in to your room to take place, plus transport and filling of the waterbirth tub.    Things that would prevent you from having a waterbirth: Premature, <37wks  Previous cesarean birth  Presence of thick meconium-stained fluid  Multiple gestation (Twins, triplets, etc.)  Uncontrolled diabetes or gestational diabetes requiring medication  Hypertension diagnosed in pregnancy or preexisting hypertension (gestational hypertension, preeclampsia, or chronic hypertension) Fetal growth restriction (your baby measures less than 10th percentile on ultrasound) Heavy vaginal bleeding  Non-reassuring fetal heart rate  Active infection (MRSA, etc.). Group B Strep is NOT a contraindication for waterbirth.  If your labor has to be induced and induction method requires continuous monitoring of the baby's heart rate  Other risks/issues identified by your obstetrical provider   Please remember that birth is unpredictable. Under certain unforeseeable circumstances your provider may advise against giving birth in the tub. These decisions will be made on a case-by-case basis and with the safety of you and your baby as our highest priority.    Updated 01/21/22   Contraception Choices Contraception, also called birth control, refers to methods or devices that prevent pregnancy. Hormonal methods  Contraceptive implant A contraceptive implant is a thin, plastic tube that contains a hormone that prevents pregnancy. It is different from an intrauterine   device (IUD). It is inserted into the upper part of the arm by a health care provider. Implants can be effective for up to 3 years. Progestin-only injections Progestin-only injections are injections of progestin, a  synthetic form of the hormone progesterone. They are given every 3 months by a health care provider. Birth control pills Birth control pills are pills that contain hormones that prevent pregnancy. They must be taken once a day, preferably at the same time each day. A prescription is needed to use this method of contraception. Birth control patch The birth control patch contains hormones that prevent pregnancy. It is placed on the skin and must be changed once a week for three weeks and removed on the fourth week. A prescription is needed to use this method of contraception. Vaginal ring A vaginal ring contains hormones that prevent pregnancy. It is placed in the vagina for three weeks and removed on the fourth week. After that, the process is repeated with a new ring. A prescription is needed to use this method of contraception. Emergency contraceptive Emergency contraceptives prevent pregnancy after unprotected sex. They come in pill form and can be taken up to 5 days after sex. They work best the sooner they are taken after having sex. Most emergency contraceptives are available without a prescription. This method should not be used as your only form of birth control. Barrier methods  Female condom A female condom is a thin sheath that is worn over the penis during sex. Condoms keep sperm from going inside a woman's body. They can be used with a sperm-killing substance (spermicide) to increase their effectiveness. They should be thrown away after one use. Female condom A female condom is a soft, loose-fitting sheath that is put into the vagina before sex. The condom keeps sperm from going inside a woman's body. They should be thrown away after one use. Diaphragm A diaphragm is a soft, dome-shaped barrier. It is inserted into the vagina before sex, along with a spermicide. The diaphragm blocks sperm from entering the uterus, and the spermicide kills sperm. A diaphragm should be left in the vagina for  6-8 hours after sex and removed within 24 hours. A diaphragm is prescribed and fitted by a health care provider. A diaphragm should be replaced every 1-2 years, after giving birth, after gaining more than 15 lb (6.8 kg), and after pelvic surgery. Cervical cap A cervical cap is a round, soft latex or plastic cup that fits over the cervix. It is inserted into the vagina before sex, along with spermicide. It blocks sperm from entering the uterus. The cap should be left in place for 6-8 hours after sex and removed within 48 hours. A cervical cap must be prescribed and fitted by a health care provider. It should be replaced every 2 years. Sponge A sponge is a soft, circular piece of polyurethane foam with spermicide in it. The sponge helps block sperm from entering the uterus, and the spermicide kills sperm. To use it, you make it wet and then insert it into the vagina. It should be inserted before sex, left in for at least 6 hours after sex, and removed and thrown away within 30 hours. Spermicides Spermicides are chemicals that kill or block sperm from entering the cervix and uterus. They can come as a cream, jelly, suppository, foam, or tablet. A spermicide should be inserted into the vagina with an applicator at least 10-15 minutes before sex to allow time for it to work. The process must   be repeated every time you have sex. Spermicides do not require a prescription. Intrauterine contraception Intrauterine device (IUD) An IUD is a T-shaped device that is put in a woman's uterus. There are two types: Hormone IUD.This type contains progestin, a synthetic form of the hormone progesterone. This type can stay in place for 3-5 years. Copper IUD.This type is wrapped in copper wire. It can stay in place for 10 years. Permanent methods of contraception Female tubal ligation In this method, a woman's fallopian tubes are sealed, tied, or blocked during surgery to prevent eggs from traveling to the  uterus. Hysteroscopic sterilization In this method, a small, flexible insert is placed into each fallopian tube. The inserts cause scar tissue to form in the fallopian tubes and block them, so sperm cannot reach an egg. The procedure takes about 3 months to be effective. Another form of birth control must be used during those 3 months. Female sterilization This is a procedure to tie off the tubes that carry sperm (vasectomy). After the procedure, the man can still ejaculate fluid (semen). Another form of birth control must be used for 3 months after the procedure. Natural planning methods Natural family planning In this method, a couple does not have sex on days when the woman could become pregnant. Calendar method In this method, the woman keeps track of the length of each menstrual cycle, identifies the days when pregnancy can happen, and does not have sex on those days. Ovulation method In this method, a couple avoids sex during ovulation. Symptothermal method This method involves not having sex during ovulation. The woman typically checks for ovulation by watching changes in her temperature and in the consistency of cervical mucus. Post-ovulation method In this method, a couple waits to have sex until after ovulation. Where to find more information Centers for Disease Control and Prevention: www.cdc.gov Summary Contraception, also called birth control, refers to methods or devices that prevent pregnancy. Hormonal methods of contraception include implants, injections, pills, patches, vaginal rings, and emergency contraceptives. Barrier methods of contraception can include female condoms, female condoms, diaphragms, cervical caps, sponges, and spermicides. There are two types of IUDs (intrauterine devices). An IUD can be put in a woman's uterus to prevent pregnancy for 3-5 years. Permanent sterilization can be done through a procedure for males and females. Natural family planning methods  involve nothaving sex on days when the woman could become pregnant. This information is not intended to replace advice given to you by your health care provider. Make sure you discuss any questions you have with your health care provider. Document Revised: 03/11/2020 Document Reviewed: 03/11/2020 Elsevier Patient Education  2023 Elsevier Inc.  

## 2022-06-17 ENCOUNTER — Encounter: Payer: Self-pay | Admitting: Family Medicine

## 2022-06-26 ENCOUNTER — Encounter: Payer: Self-pay | Admitting: Family Medicine

## 2022-07-06 ENCOUNTER — Encounter: Payer: Self-pay | Admitting: Family Medicine

## 2022-07-06 ENCOUNTER — Other Ambulatory Visit: Payer: Self-pay

## 2022-07-06 ENCOUNTER — Ambulatory Visit (INDEPENDENT_AMBULATORY_CARE_PROVIDER_SITE_OTHER): Payer: Medicaid Other | Admitting: Family Medicine

## 2022-07-06 ENCOUNTER — Other Ambulatory Visit (HOSPITAL_COMMUNITY)
Admission: RE | Admit: 2022-07-06 | Discharge: 2022-07-06 | Disposition: A | Discharge status: Home / Self Care | Payer: Medicaid Other | Source: Ambulatory Visit | Attending: Family Medicine | Admitting: Family Medicine

## 2022-07-06 VITALS — BP 134/77 | HR 77 | Wt 206.6 lb

## 2022-07-06 DIAGNOSIS — Z3402 Encounter for supervision of normal first pregnancy, second trimester: Secondary | ICD-10-CM | POA: Insufficient documentation

## 2022-07-06 DIAGNOSIS — F419 Anxiety disorder, unspecified: Secondary | ICD-10-CM

## 2022-07-06 DIAGNOSIS — F32A Depression, unspecified: Secondary | ICD-10-CM

## 2022-07-06 DIAGNOSIS — O10911 Unspecified pre-existing hypertension complicating pregnancy, first trimester: Secondary | ICD-10-CM

## 2022-07-06 DIAGNOSIS — O99210 Obesity complicating pregnancy, unspecified trimester: Secondary | ICD-10-CM

## 2022-07-06 NOTE — Progress Notes (Signed)
PRENATAL VISIT NOTE  Subjective:  Rebecca Navarro is a 21 y.o. G1P0 at 35w0dbeing seen today for ongoing prenatal care.  She is currently monitored for the following issues for this low-risk pregnancy and has Severe episode of recurrent major depressive disorder, without psychotic features ([place]; Anxiety disorder of adolescence; Self-harm; Degenerative disc disease, lumbar; Chronic bilateral low back pain without sciatica; Encounter for supervision of normal first pregnancy in second trimester; Chronic hypertension in obstetric context in first trimester; Obesity in pregnancy; Anxiety and depression; and Rash on their problem list.  Patient reports no complaints.  Contractions: Not present. Vag. Bleeding: None.  Movement: Present. Denies leaking of fluid.   The following portions of the patient's history were reviewed and updated as appropriate: allergies, current medications, past family history, past medical history, past social history, past surgical history and problem list.   Objective:   Vitals:   07/06/22 0830  BP: 134/77  Pulse: 77  Weight: 206 lb 9.6 oz (93.7 kg)    Fetal Status: Fetal Heart Rate (bpm): 142 Fundal Height: 38 cm Movement: Present     General:  Alert, oriented and cooperative. Patient is in no acute distress.  Skin: Skin is warm and dry. No rash noted.   Cardiovascular: Normal heart rate noted  Respiratory: Normal respiratory effort, no problems with respiration noted  Abdomen: Soft, gravid, appropriate for gestational age.  Pain/Pressure: Absent     Pelvic: Cervical exam deferred        Extremities: Normal range of motion.     Mental Status: Normal mood and affect. Normal behavior. Normal judgment and thought content.   Assessment and Plan:  Pregnancy: G1P0 at 368w0d1. Encounter for supervision of normal first pregnancy in second trimester Patient was wondering about waterbirth--I have reviewed her chart and she is not a candidate due to [place] Of note, she has had elevated BP that met criteria but has only been mild range and has been normotensive for 6 months.   Discussed that she can use hydrotherapy but is not eligible for immersion in water for birth. I encouraged her to complete the waterbirth class but also was very clear that birth in the water would not be an option.   Patient was upset and frustrated-- she pointed out she had only had two elevated blood pressures and they have been normal since. She became tearful that her sister "wast forced to get medicines" and the patient "does not want that."  I provided active listening and validated that being forced to do anything is not right and that our group supports her autonomy.   The patient said "[facility] don't want to have forced to have medicines."  I reviewed that we support unmedicated birth regularly and most unmedicated births are not waterbirths. She was shared "her sisters doctor wasn't even there when she delivered" and I was clear that our team is in the hospital 24/7 to support our patients in whatever birth they want. I reviewed again the support of unmedicated birth.  I affirmed that most hospitals that provide waterbirth have policies that women with hypertension are not able to deliver in the water.  The patient was still upset when she left.   Patient self collected her swab per her desire. She was instructed on the method of collection. - Culture, beta strep (group b only) - GC/Chlamydia probe amp ([place]. Obesity in pregnancy TWG= 14  lb 9.6 oz (6.623 kg)   3. Chronic hypertension in obstetric context in first trimester BP is WNL  4. Anxiety and depression Did not address today directly but likely plays a role in above waterbirth discussion  Preterm labor symptoms and general obstetric precautions including but not limited to vaginal bleeding, contractions, leaking of fluid and fetal movement were reviewed  in detail with the patient. Please refer to After Visit Summary for other counseling recommendations.   Return in about 1 week (around 07/13/2022) for Routine prenatal care.  Future Appointments  Date Time Provider [place]  07/07/2022  3:30 PM [facility] NURSE [facility]  07/07/2022  3:45 PM [facility] US1 [facility]  07/15/2022  8:15 AM Clarnce Flock, MD [facility]  07/24/2022 10:15 AM Clarnce Flock, MD [facility]  07/29/2022  3:15 PM Clarnce Flock, MD [facility]  08/05/2022  8:15 AM [facility]  08/05/2022  9:15 AM Dione Plover, Annice Needy, MD [facility]    Caren Macadam, MD

## 2022-07-07 ENCOUNTER — Ambulatory Visit: Payer: Medicaid Other | Attending: Obstetrics and Gynecology

## 2022-07-07 ENCOUNTER — Ambulatory Visit: Payer: Medicaid Other | Admitting: *Deleted

## 2022-07-07 VITALS — BP 136/79 | HR 96

## 2022-07-07 DIAGNOSIS — O10013 Pre-existing essential hypertension complicating pregnancy, third trimester: Secondary | ICD-10-CM | POA: Diagnosis not present

## 2022-07-07 DIAGNOSIS — R638 Other symptoms and signs concerning food and fluid intake: Secondary | ICD-10-CM

## 2022-07-07 DIAGNOSIS — E669 Obesity, unspecified: Secondary | ICD-10-CM

## 2022-07-07 DIAGNOSIS — O10913 Unspecified pre-existing hypertension complicating pregnancy, third trimester: Secondary | ICD-10-CM | POA: Diagnosis present

## 2022-07-07 DIAGNOSIS — O99213 Obesity complicating pregnancy, third trimester: Secondary | ICD-10-CM | POA: Diagnosis not present

## 2022-07-07 DIAGNOSIS — Z3402 Encounter for supervision of normal first pregnancy, second trimester: Secondary | ICD-10-CM | POA: Insufficient documentation

## 2022-07-07 DIAGNOSIS — Z3A36 36 weeks gestation of pregnancy: Secondary | ICD-10-CM

## 2022-07-07 LAB — GC/CHLAMYDIA PROBE AMP (~~LOC~~) NOT AT ARMC
Chlamydia: NEGATIVE
Comment: NEGATIVE
Comment: NORMAL
Neisseria Gonorrhea: NEGATIVE

## 2022-07-10 LAB — CULTURE, BETA STREP (GROUP B ONLY): Strep Gp B Culture: NEGATIVE

## 2022-07-15 ENCOUNTER — Ambulatory Visit (INDEPENDENT_AMBULATORY_CARE_PROVIDER_SITE_OTHER): Payer: Medicaid Other | Admitting: Family Medicine

## 2022-07-15 ENCOUNTER — Other Ambulatory Visit: Payer: Self-pay

## 2022-07-15 ENCOUNTER — Encounter: Payer: Self-pay | Admitting: Family Medicine

## 2022-07-15 VITALS — BP 121/80 | HR 83 | Wt 209.8 lb

## 2022-07-15 DIAGNOSIS — F419 Anxiety disorder, unspecified: Secondary | ICD-10-CM

## 2022-07-15 DIAGNOSIS — Z3402 Encounter for supervision of normal first pregnancy, second trimester: Secondary | ICD-10-CM

## 2022-07-15 DIAGNOSIS — F32A Depression, unspecified: Secondary | ICD-10-CM

## 2022-07-15 DIAGNOSIS — O10911 Unspecified pre-existing hypertension complicating pregnancy, first trimester: Secondary | ICD-10-CM

## 2022-07-15 NOTE — Progress Notes (Signed)
   Subjective:  Rebecca Navarro is a 21 y.o. G1P0 at [redacted]w[redacted]d being seen today for ongoing prenatal care.  She is currently monitored for the following issues for this high-risk pregnancy and has Severe episode of recurrent major depressive disorder, without psychotic features ([place]); Anxiety disorder of adolescence; Self-harm; Degenerative disc disease, lumbar; Chronic bilateral low back pain without sciatica; Encounter for supervision of normal first pregnancy in second trimester; Chronic hypertension in obstetric context in first trimester; Obesity in pregnancy; Anxiety and depression; and Rash on their problem list.  Patient reports no complaints.  Contractions: Irritability. Vag. Bleeding: None.  Movement: Present. Denies leaking of fluid.   The following portions of the patient's history were reviewed and updated as appropriate: allergies, current medications, past family history, past medical history, past social history, past surgical history and problem list. Problem list updated.  Objective:   Vitals:   07/15/22 0824  BP: 121/80  Pulse: 83  Weight: 209 lb 12.8 oz (95.2 kg)    Fetal Status: Fetal Heart Rate (bpm): 143   Movement: Present     General:  Alert, oriented and cooperative. Patient is in no acute distress.  Skin: Skin is warm and dry. No rash noted.   Cardiovascular: Normal heart rate noted  Respiratory: Normal respiratory effort, no problems with respiration noted  Abdomen: Soft, gravid, appropriate for gestational age. Pain/Pressure: Present     Pelvic: Vag. Bleeding: None     Cervical exam deferred        Extremities: Normal range of motion.     Mental Status: Normal mood and affect. Normal behavior. Normal judgment and thought content.   Urinalysis:      Assessment and Plan:  Pregnancy: G1P0 at [redacted]w[redacted]d  1. Encounter for supervision of normal first pregnancy in second trimester BP and FHR normal Cephalic by [place] last week  2. Chronic hypertension in  obstetric context in first trimester Normotensive without meds Not on ASA due to allergy Discussed timing of delivery, would like to have IOL on day that I am there Scheduled for 07/30/2022 at midnight Offered outpatient foley on 07/29/2022, accepts Orders placed, form faxed  3. Anxiety and depression stable  Term labor symptoms and general obstetric precautions including but not limited to vaginal bleeding, contractions, leaking of fluid and fetal movement were reviewed in detail with the patient. Please refer to After Visit Summary for other counseling recommendations.  Return in 1 week (on 07/22/2022) for Dyad patient, ob visit.   Clarnce Flock, MD

## 2022-07-15 NOTE — Patient Instructions (Signed)

## 2022-07-16 ENCOUNTER — Encounter: Payer: Medicaid Other | Admitting: Family Medicine

## 2022-07-23 ENCOUNTER — Telehealth (HOSPITAL_COMMUNITY): Payer: Self-pay | Admitting: *Deleted

## 2022-07-23 ENCOUNTER — Encounter (HOSPITAL_COMMUNITY): Payer: Self-pay | Admitting: *Deleted

## 2022-07-23 NOTE — Telephone Encounter (Signed)
Preadmission screen  

## 2022-07-24 ENCOUNTER — Encounter: Payer: Self-pay | Admitting: Family Medicine

## 2022-07-24 ENCOUNTER — Ambulatory Visit (INDEPENDENT_AMBULATORY_CARE_PROVIDER_SITE_OTHER): Payer: Medicaid Other | Admitting: Family Medicine

## 2022-07-24 ENCOUNTER — Other Ambulatory Visit: Payer: Self-pay

## 2022-07-24 VITALS — BP 135/85 | HR 77 | Wt 211.6 lb

## 2022-07-24 DIAGNOSIS — Z3402 Encounter for supervision of normal first pregnancy, second trimester: Secondary | ICD-10-CM

## 2022-07-24 DIAGNOSIS — F32A Depression, unspecified: Secondary | ICD-10-CM

## 2022-07-24 DIAGNOSIS — F419 Anxiety disorder, unspecified: Secondary | ICD-10-CM

## 2022-07-24 DIAGNOSIS — O10911 Unspecified pre-existing hypertension complicating pregnancy, first trimester: Secondary | ICD-10-CM

## 2022-07-24 NOTE — Patient Instructions (Signed)

## 2022-07-24 NOTE — Progress Notes (Signed)
   Subjective:  Rebecca Navarro is a 21 y.o. G1P0 at [redacted]w[redacted]d being seen today for ongoing prenatal care.  She is currently monitored for the following issues for this high-risk pregnancy and has Severe episode of recurrent major depressive disorder, without psychotic features ([place]); Anxiety disorder of adolescence; Self-harm; Degenerative disc disease, lumbar; Chronic bilateral low back pain without sciatica; Encounter for supervision of normal first pregnancy in second trimester; Chronic hypertension in obstetric context in first trimester; Obesity in pregnancy; Anxiety and depression; and Rash on their problem list.  Patient reports no complaints.  Contractions: Irritability. Vag. Bleeding: None.  Movement: Present. Denies leaking of fluid.   The following portions of the patient's history were reviewed and updated as appropriate: allergies, current medications, past family history, past medical history, past social history, past surgical history and problem list. Problem list updated.  Objective:   Vitals:   07/24/22 1028  BP: 135/85  Pulse: 77  Weight: 211 lb 9.6 oz (96 kg)    Fetal Status: Fetal Heart Rate (bpm): 153   Movement: Present  Presentation: Vertex  General:  Alert, oriented and cooperative. Patient is in no acute distress.  Skin: Skin is warm and dry. No rash noted.   Cardiovascular: Normal heart rate noted  Respiratory: Normal respiratory effort, no problems with respiration noted  Abdomen: Soft, gravid, appropriate for gestational age. Pain/Pressure: Present     Pelvic: Vag. Bleeding: None     Cervical exam performed Dilation: 3 Effacement (%): 70 Station: -2  Extremities: Normal range of motion.     Mental Status: Normal mood and affect. Normal behavior. Normal judgment and thought content.   Urinalysis:      Assessment and Plan:  Pregnancy: G1P0 at [redacted]w[redacted]d  1. Encounter for supervision of normal first pregnancy in second trimester BP and FHR normal Cervix  very favorable. Will cancel outpatient foley as she is already 3 cm, plan for IOL next Thursday  2. Chronic hypertension in obstetric context in first trimester Normotensive without meds, not on ASA due to allergy Scheduled for foley bulb next Wednesday but will cancel given cervical exam today, midnight IOL on Thursday   3. Anxiety and depression Stable  Term labor symptoms and general obstetric precautions including but not limited to vaginal bleeding, contractions, leaking of fluid and fetal movement were reviewed in detail with the patient. Please refer to After Visit Summary for other counseling recommendations.  Return in 1 week (on 07/31/2022) for Dyad patient, ob visit.   Clarnce Flock, MD

## 2022-07-26 ENCOUNTER — Other Ambulatory Visit: Payer: Self-pay

## 2022-07-26 ENCOUNTER — Inpatient Hospital Stay (HOSPITAL_COMMUNITY)
Admission: AD | Admit: 2022-07-26 | Discharge: 2022-07-26 | Disposition: A | Discharge status: Home / Self Care | Payer: Medicaid Other | Attending: Obstetrics and Gynecology | Admitting: Obstetrics and Gynecology

## 2022-07-26 ENCOUNTER — Encounter (HOSPITAL_COMMUNITY): Payer: Self-pay | Admitting: Obstetrics and Gynecology

## 2022-07-26 DIAGNOSIS — O479 False labor, unspecified: Secondary | ICD-10-CM

## 2022-07-26 DIAGNOSIS — Z3A38 38 weeks gestation of pregnancy: Secondary | ICD-10-CM | POA: Insufficient documentation

## 2022-07-26 DIAGNOSIS — O471 False labor at or after 37 completed weeks of gestation: Secondary | ICD-10-CM | POA: Insufficient documentation

## 2022-07-26 NOTE — MAU Provider Note (Signed)
None      S: Ms. Rebecca Navarro is a 21 y.o. G1P0 at 101w6d  who presents to MAU today complaining contractions q 5 minutes since 0700. She denies vaginal bleeding. She denies LOF. She reports normal fetal movement.    O: BP 122/76   Pulse 83   Temp 98.1 F (36.7 C) (Oral)   Resp 15   LMP 10/27/2021   SpO2 98%  GENERAL: Well-developed, well-nourished female in no acute distress.  HEAD: Normocephalic, atraumatic.  CHEST: Normal effort of breathing, regular heart rate ABDOMEN: Soft, nontender, gravid  Cervical exam:  Dilation: 3 Effacement (%): 50 Cervical Position: Posterior Station: -2 Presentation: Vertex Exam by:: m aube   Fetal Monitoring: Baseline: 140 bpm  Variability: moderate Accelerations: 15x15 accels present  Decelerations: absent Contractions: 10-15 mins    A: SIUP at [redacted]w[redacted]d  False labor Plan for discharge   P: 1. False labor   2. [redacted] weeks gestation of pregnancy   3. Braxton Hick's contraction    - Discussed difference in early labor, false labor and active labor.  - Term labor precautions reviewed.  - Worsening signs and symptoms and return precautions reviewed.  - FHT Cat I upon discharge - Patient stable upon discharge, and may return to MAU as needed.   Deloris Ping, CNM 07/26/2022 11:49 AM

## 2022-07-26 NOTE — MAU Note (Signed)
.  Rebecca Navarro is a 21 y.o. at [redacted]w[redacted]d here in MAU reporting: ctx  q 5 min since 0700. Denies LOF, reports some brown dc. +FM Pt reports ctx are not currently painful.   Onset of complaint: 0700 Pain score: 0 There were no vitals filed for this visit.   FHT:130

## 2022-07-27 ENCOUNTER — Encounter: Payer: Self-pay | Admitting: Family Medicine

## 2022-07-27 ENCOUNTER — Inpatient Hospital Stay (HOSPITAL_COMMUNITY)
Admission: AD | Admit: 2022-07-27 | Discharge: 2022-07-30 | DRG: 807 | Disposition: A | Discharge status: Home / Self Care | Payer: Medicaid Other | Attending: Family Medicine | Admitting: Family Medicine

## 2022-07-27 ENCOUNTER — Encounter (HOSPITAL_COMMUNITY): Payer: Self-pay | Admitting: Family Medicine

## 2022-07-27 DIAGNOSIS — Z3402 Encounter for supervision of normal first pregnancy, second trimester: Principal | ICD-10-CM

## 2022-07-27 DIAGNOSIS — Z8616 Personal history of COVID-19: Secondary | ICD-10-CM

## 2022-07-27 DIAGNOSIS — O10911 Unspecified pre-existing hypertension complicating pregnancy, first trimester: Secondary | ICD-10-CM | POA: Diagnosis present

## 2022-07-27 DIAGNOSIS — Z8249 Family history of ischemic heart disease and other diseases of the circulatory system: Secondary | ICD-10-CM

## 2022-07-27 DIAGNOSIS — O99214 Obesity complicating childbirth: Secondary | ICD-10-CM | POA: Diagnosis present

## 2022-07-27 DIAGNOSIS — O1002 Pre-existing essential hypertension complicating childbirth: Principal | ICD-10-CM | POA: Diagnosis present

## 2022-07-27 DIAGNOSIS — Z3A39 39 weeks gestation of pregnancy: Secondary | ICD-10-CM

## 2022-07-27 NOTE — MAU Note (Signed)
Pt says feels UC Strong since 1025pm [facility]- clinic  VE- on Friday- 3 cm Denies HSV GBS- neg At 908pm- started H/A - 149/87 H/A- pain - 4 , no blurred vision , no epigastric pain .  No Tyl for H/A

## 2022-07-28 ENCOUNTER — Encounter (HOSPITAL_COMMUNITY): Payer: Self-pay | Admitting: Family Medicine

## 2022-07-28 ENCOUNTER — Inpatient Hospital Stay (HOSPITAL_COMMUNITY): Payer: Medicaid Other | Admitting: Anesthesiology

## 2022-07-28 ENCOUNTER — Other Ambulatory Visit: Payer: Self-pay

## 2022-07-28 DIAGNOSIS — O99324 Drug use complicating childbirth: Secondary | ICD-10-CM

## 2022-07-28 DIAGNOSIS — O322XX0 Maternal care for transverse and oblique lie, not applicable or unspecified: Secondary | ICD-10-CM

## 2022-07-28 DIAGNOSIS — O1002 Pre-existing essential hypertension complicating childbirth: Secondary | ICD-10-CM

## 2022-07-28 DIAGNOSIS — O26893 Other specified pregnancy related conditions, third trimester: Secondary | ICD-10-CM | POA: Diagnosis present

## 2022-07-28 DIAGNOSIS — Z8249 Family history of ischemic heart disease and other diseases of the circulatory system: Secondary | ICD-10-CM | POA: Diagnosis not present

## 2022-07-28 DIAGNOSIS — Z3A39 39 weeks gestation of pregnancy: Secondary | ICD-10-CM

## 2022-07-28 DIAGNOSIS — O99214 Obesity complicating childbirth: Secondary | ICD-10-CM | POA: Diagnosis present

## 2022-07-28 DIAGNOSIS — Z8616 Personal history of COVID-19: Secondary | ICD-10-CM | POA: Diagnosis not present

## 2022-07-28 LAB — COMPREHENSIVE METABOLIC PANEL
ALT: 15 U/L (ref 0–44)
AST: 22 U/L (ref 15–41)
Albumin: 2.8 g/dL — ABNORMAL LOW (ref 3.5–5.0)
Alkaline Phosphatase: 111 U/L (ref 38–126)
Anion gap: 11 (ref 5–15)
BUN: 13 mg/dL (ref 6–20)
CO2: 18 mmol/L — ABNORMAL LOW (ref 22–32)
Calcium: 9 mg/dL (ref 8.9–10.3)
Chloride: 105 mmol/L (ref 98–111)
Creatinine, Ser: 0.73 mg/dL (ref 0.44–1.00)
GFR, Estimated: >60 mL/min (ref >60)
Glucose, Bld: 102 mg/dL — ABNORMAL HIGH (ref 70–99)
Potassium: 3.8 mmol/L (ref 3.5–5.1)
Sodium: 134 mmol/L — ABNORMAL LOW (ref 135–145)
Total Bilirubin: 0.6 mg/dL (ref 0.3–1.2)
Total Protein: 6.3 g/dL — ABNORMAL LOW (ref 6.5–8.1)

## 2022-07-28 LAB — CBC
HCT: 35.4 % — ABNORMAL LOW (ref 36.0–46.0)
HCT: 37.6 % (ref 36.0–46.0)
Hemoglobin: 11.8 g/dL — ABNORMAL LOW (ref 12.0–15.0)
Hemoglobin: 12.4 g/dL (ref 12.0–15.0)
MCH: 29.6 pg (ref 26.0–34.0)
MCH: 28.7 pg (ref 26.0–34.0)
MCHC: 33.3 g/dL (ref 30.0–36.0)
MCHC: 33 g/dL (ref 30.0–36.0)
MCV: 88.7 fL (ref 80.0–100.0)
MCV: 87 fL (ref 80.0–100.0)
Platelets: 260 10*3/uL (ref 150–400)
Platelets: 235 10*3/uL (ref 150–400)
RBC: 3.99 MIL/uL (ref 3.87–5.11)
RBC: 4.32 MIL/uL (ref 3.87–5.11)
RDW: 12.7 % (ref 11.5–15.5)
RDW: 12.8 % (ref 11.5–15.5)
WBC: 10 10*3/uL (ref 4.0–10.5)
WBC: 10.3 10*3/uL (ref 4.0–10.5)
nRBC: 0 % (ref 0.0–0.2)
nRBC: 0 % (ref 0.0–0.2)

## 2022-07-28 LAB — TYPE AND SCREEN
ABO/RH(D): O POS
Antibody Screen: NEGATIVE

## 2022-07-28 LAB — PROTEIN / CREATININE RATIO, URINE
Creatinine, Urine: 56 mg/dL
Total Protein, Urine: <6 mg/dL

## 2022-07-28 LAB — RPR: RPR Ser Ql: NONREACTIVE

## 2022-07-28 MED ORDER — SODIUM CHLORIDE 0.9% FLUSH: 3.0000 mL | INTRAVENOUS | Status: DC | PRN

## 2022-07-28 MED ORDER — OXYCODONE HCL 5 MG PO TABS: 10.0000 mg | ORAL_TABLET | ORAL | Status: DC | PRN

## 2022-07-28 MED ORDER — OXYTOCIN-SODIUM CHLORIDE 30-0.9 UT/500ML-% IV SOLN: 2.5000 [IU]/h | INTRAVENOUS | Status: DC

## 2022-07-28 MED ORDER — WITCH HAZEL-GLYCERIN EX PADS: 1.0000 | MEDICATED_PAD | CUTANEOUS | Status: DC | PRN

## 2022-07-28 MED ORDER — LIDOCAINE HCL (PF) 1 % IJ SOLN
INTRAMUSCULAR | Status: DC | PRN
  Administered 2022-07-28: 10 mL via EPIDURAL
  Administered 2022-07-28: 2 mL via EPIDURAL

## 2022-07-28 MED ORDER — TETANUS-DIPHTH-ACELL PERTUSSIS 5-2.5-18.5 LF-MCG/0.5 IM SUSY: 0.5000 mL | PREFILLED_SYRINGE | Freq: Once | INTRAMUSCULAR | Status: DC

## 2022-07-28 MED ORDER — PRENATAL MULTIVITAMIN CH
1.0000 | ORAL_TABLET | Freq: Every day | ORAL | Status: DC
  Administered 2022-07-29 – 2022-07-30 (×2): 1 via ORAL
  Filled 2022-07-28 (×2): qty 1

## 2022-07-28 MED ORDER — EPHEDRINE 5 MG/ML INJ: 10.0000 mg | INTRAVENOUS | Status: DC | PRN

## 2022-07-28 MED ORDER — ONDANSETRON HCL 4 MG/2ML IJ SOLN: 4.0000 mg | INTRAMUSCULAR | Status: DC | PRN

## 2022-07-28 MED ORDER — SIMETHICONE 80 MG PO CHEW: 80.0000 mg | CHEWABLE_TABLET | ORAL | Status: DC | PRN

## 2022-07-28 MED ORDER — LACTATED RINGERS IV SOLN: 500.0000 mL | Freq: Once | INTRAVENOUS | Status: DC

## 2022-07-28 MED ORDER — FUROSEMIDE 20 MG PO TABS
20.0000 mg | ORAL_TABLET | Freq: Every day | ORAL | Status: DC
  Administered 2022-07-28 – 2022-07-30 (×3): 20 mg via ORAL
  Filled 2022-07-28 (×3): qty 1

## 2022-07-28 MED ORDER — ONDANSETRON HCL 4 MG PO TABS: 4.0000 mg | ORAL_TABLET | ORAL | Status: DC | PRN

## 2022-07-28 MED ORDER — DIPHENHYDRAMINE HCL 25 MG PO CAPS: 25.0000 mg | ORAL_CAPSULE | Freq: Four times a day (QID) | ORAL | Status: DC | PRN

## 2022-07-28 MED ORDER — LACTATED RINGERS IV SOLN: 500.0000 mL | INTRAVENOUS | Status: DC | PRN

## 2022-07-28 MED ORDER — SODIUM CHLORIDE 0.9 % IV SOLN: 250.0000 mL | INTRAVENOUS | Status: DC | PRN

## 2022-07-28 MED ORDER — FLEET ENEMA 7-19 GM/118ML RE ENEM: 1.0000 | ENEMA | RECTAL | Status: DC | PRN

## 2022-07-28 MED ORDER — DIPHENHYDRAMINE HCL 50 MG/ML IJ SOLN: 12.5000 mg | INTRAMUSCULAR | Status: DC | PRN

## 2022-07-28 MED ORDER — OXYCODONE-ACETAMINOPHEN 5-325 MG PO TABS: 2.0000 | ORAL_TABLET | ORAL | Status: DC | PRN

## 2022-07-28 MED ORDER — LACTATED RINGERS IV SOLN: INTRAVENOUS | Status: DC

## 2022-07-28 MED ORDER — SODIUM CHLORIDE 0.9% FLUSH: 3.0000 mL | Freq: Two times a day (BID) | INTRAVENOUS | Status: DC

## 2022-07-28 MED ORDER — FLUTICASONE PROPIONATE 50 MCG/ACT NA SUSP
1.0000 | Freq: Every day | NASAL | Status: DC
  Administered 2022-07-29 – 2022-07-30 (×2): 1 via NASAL
  Filled 2022-07-28: qty 16

## 2022-07-28 MED ORDER — OXYTOCIN-SODIUM CHLORIDE 30-0.9 UT/500ML-% IV SOLN
1.0000 m[IU]/min | INTRAVENOUS | Status: DC
  Administered 2022-07-28: 2 m[IU]/min via INTRAVENOUS
  Filled 2022-07-28: qty 500

## 2022-07-28 MED ORDER — FENTANYL-BUPIVACAINE-NACL 0.5-0.125-0.9 MG/250ML-% EP SOLN: 12.0000 mL/h | EPIDURAL | Status: DC | PRN

## 2022-07-28 MED ORDER — ONDANSETRON HCL 4 MG/2ML IJ SOLN: 4.0000 mg | Freq: Four times a day (QID) | INTRAMUSCULAR | Status: DC | PRN

## 2022-07-28 MED ORDER — OXYCODONE-ACETAMINOPHEN 5-325 MG PO TABS: 1.0000 | ORAL_TABLET | ORAL | Status: DC | PRN

## 2022-07-28 MED ORDER — CAFFEINE 200 MG PO TABS
200.0000 mg | ORAL_TABLET | Freq: Once | ORAL | Status: AC
  Administered 2022-07-28: 200 mg via ORAL
  Filled 2022-07-28: qty 1

## 2022-07-28 MED ORDER — OXYCODONE HCL 5 MG PO TABS: 5.0000 mg | ORAL_TABLET | ORAL | Status: DC | PRN

## 2022-07-28 MED ORDER — FENTANYL-BUPIVACAINE-NACL 0.5-0.125-0.9 MG/250ML-% EP SOLN
EPIDURAL | Status: DC | PRN
  Administered 2022-07-28: 12 mL/h via EPIDURAL

## 2022-07-28 MED ORDER — SOD CITRATE-CITRIC ACID 500-334 MG/5ML PO SOLN: 30.0000 mL | ORAL | Status: DC | PRN

## 2022-07-28 MED ORDER — FENTANYL-BUPIVACAINE-NACL 0.5-0.125-0.9 MG/250ML-% EP SOLN
EPIDURAL | Status: AC
  Filled 2022-07-28: qty 250

## 2022-07-28 MED ORDER — DIBUCAINE (PERIANAL) 1 % EX OINT: 1.0000 | TOPICAL_OINTMENT | CUTANEOUS | Status: DC | PRN

## 2022-07-28 MED ORDER — BENZOCAINE-MENTHOL 20-0.5 % EX AERO
1.0000 | INHALATION_SPRAY | CUTANEOUS | Status: DC | PRN
  Administered 2022-07-28 – 2022-07-30 (×2): 1 via TOPICAL
  Filled 2022-07-28 (×2): qty 56

## 2022-07-28 MED ORDER — PHENYLEPHRINE 80 MCG/ML (10ML) SYRINGE FOR IV PUSH (FOR BLOOD PRESSURE SUPPORT): 80.0000 ug | PREFILLED_SYRINGE | INTRAVENOUS | Status: DC | PRN

## 2022-07-28 MED ORDER — LIDOCAINE HCL (PF) 1 % IJ SOLN: 30.0000 mL | INTRAMUSCULAR | Status: DC | PRN

## 2022-07-28 MED ORDER — FENTANYL CITRATE (PF) 100 MCG/2ML IJ SOLN
50.0000 ug | INTRAMUSCULAR | Status: DC | PRN
  Administered 2022-07-28: 100 ug via INTRAVENOUS
  Administered 2022-07-28: 50 ug via INTRAVENOUS
  Filled 2022-07-28 (×2): qty 2

## 2022-07-28 MED ORDER — TERBUTALINE SULFATE 1 MG/ML IJ SOLN: 0.2500 mg | Freq: Once | INTRAMUSCULAR | Status: DC | PRN

## 2022-07-28 MED ORDER — ACETAMINOPHEN 325 MG PO TABS
650.0000 mg | ORAL_TABLET | ORAL | Status: DC | PRN
  Administered 2022-07-28 – 2022-07-30 (×6): 650 mg via ORAL
  Filled 2022-07-28 (×6): qty 2

## 2022-07-28 MED ORDER — LACTATED RINGERS IV BOLUS: 1000.0000 mL | Freq: Once | INTRAVENOUS | Status: DC

## 2022-07-28 MED ORDER — OXYTOCIN BOLUS FROM INFUSION
333.0000 mL | Freq: Once | INTRAVENOUS | Status: AC
  Administered 2022-07-28: 333 mL via INTRAVENOUS

## 2022-07-28 MED ORDER — SENNOSIDES-DOCUSATE SODIUM 8.6-50 MG PO TABS
2.0000 | ORAL_TABLET | Freq: Every day | ORAL | Status: DC
  Administered 2022-07-29 – 2022-07-30 (×2): 2 via ORAL
  Filled 2022-07-28 (×2): qty 2

## 2022-07-28 MED ORDER — ACETAMINOPHEN 325 MG PO TABS: 650.0000 mg | ORAL_TABLET | ORAL | Status: DC | PRN

## 2022-07-28 MED ORDER — ACETAMINOPHEN 500 MG PO TABS
1000.0000 mg | ORAL_TABLET | Freq: Once | ORAL | Status: AC
  Administered 2022-07-28: 1000 mg via ORAL
  Filled 2022-07-28: qty 2

## 2022-07-28 MED ORDER — COCONUT OIL OIL: 1.0000 | TOPICAL_OIL | Status: DC | PRN

## 2022-07-28 NOTE — Progress Notes (Signed)
Rebecca Navarro is a 21 y.o. G1P0 at [redacted]w[redacted]d.  Subjective: Comfortable w/ epidural.   Objective: BP (!) 103/55   Pulse 83   Temp (!) 97.3 F (36.3 C) (Oral)   Resp 18   Ht 5\' 5"  (1.651 m)   Wt 96.8 kg   LMP 10/27/2021   SpO2 100%   BMI 35.49 kg/m    FHT:  FHR: 120 bpm, variability: mod,  accelerations:  15x15,  decelerations:  none UC:   Q 12/25/2021, strong Dilation: 8 Effacement (%): 100 Cervical Position: Middle Station: -1 Presentation: Vertex Exam by:: [email or web address] RN  Labs: Results for orders placed or performed during the hospital encounter of 07/27/22 (from the past 24 hour(s))  Comprehensive metabolic panel     Status: Abnormal   Collection Time: 07/28/22  3:29 AM  Result Value Ref Range   Sodium 134 (L) 135 - 145 mmol/L   Potassium 3.8 3.5 - 5.1 mmol/L   Chloride 105 98 - 111 mmol/L   CO2 18 (L) 22 - 32 mmol/L   Glucose, Bld 102 (H) 70 - 99 mg/dL   BUN 13 6 - 20 mg/dL   Creatinine, Ser 09/27/22 0.44 - 1.00 mg/dL   Calcium 9.0 8.9 - 7.82 mg/dL   Total Protein 6.3 (L) 6.5 - 8.1 g/dL   Albumin 2.8 (L) 3.5 - 5.0 g/dL   AST 22 15 - 41 U/L   ALT 15 0 - 44 U/L   Alkaline Phosphatase 111 38 - 126 U/L   Total Bilirubin 0.6 0.3 - 1.2 mg/dL   GFR, Estimated 95.6 >21 mL/min   Anion gap 11 5 - 15  CBC     Status: Abnormal   Collection Time: 07/28/22  3:29 AM  Result Value Ref Range   WBC 10.0 4.0 - 10.5 K/uL   RBC 3.99 3.87 - 5.11 MIL/uL   Hemoglobin 11.8 (L) 12.0 - 15.0 g/dL   HCT 09/27/22 (L) 86.5 - 78.4 %   MCV 88.7 80.0 - 100.0 fL   MCH 29.6 26.0 - 34.0 pg   MCHC 33.3 30.0 - 36.0 g/dL   RDW 69.6 29.5 - 28.4 %   Platelets 260 150 - 400 K/uL   nRBC 0.0 0.0 - 0.2 %  Type and screen [facility]     Status: None   Collection Time: 07/28/22  3:29 AM  Result Value Ref Range   ABO/RH(D) O POS    Antibody Screen NEG    Sample Expiration      07/31/2022,2359 Performed at [facility], [address] [place], [place]   RPR      Status: None   Collection Time: 07/28/22  3:29 AM  Result Value Ref Range   RPR Ser Ql NON REACTIVE NON REACTIVE  Protein / creatinine ratio, urine     Status: None   Collection Time: 07/28/22  9:53 AM  Result Value Ref Range   Creatinine, Urine 56 mg/dL   Total Protein, Urine <6 mg/dL   Protein Creatinine Ratio        0.00 - 0.15 mg/mg[Cre]  CBC     Status: None   Collection Time: 07/28/22 11:44 AM  Result Value Ref Range   WBC 10.3 4.0 - 10.5 K/uL   RBC 4.32 3.87 - 5.11 MIL/uL   Hemoglobin 12.4 12.0 - 15.0 g/dL   HCT 09/27/22 27.2 - 53.6 %   MCV 87.0 80.0 - 100.0 fL   MCH 28.7 26.0 -  34.0 pg   MCHC 33.0 30.0 - 36.0 g/dL   RDW 12.8 11.5 - 15.5 %   Platelets 235 150 - 400 K/uL   nRBC 0.0 0.0 - 0.2 %    Assessment / Plan: [redacted]w[redacted]d week IUP Labor: Transition. Progressing well Fetal Wellbeing:  Category I Pain Control:  Epidural Anticipated MOD:  SVD CHNT: Stable. No evidence of Pre-E.   Tamala Julian, [place], [place] 07/28/2022 2:54 PM

## 2022-07-28 NOTE — Lactation Note (Signed)
This note was copied from a baby's chart. Lactation Consultation Note  Patient Name: Rebecca Navarro [id number] Date: 07/28/2022 Reason for consult: L&D Initial assessment;1st time breastfeeding;Primapara;Term Age:20 hours   Initial L&D Consult:  Visited with family < 1 hour after birth Assisted to latch, however, baby was not interested in initiating a suck once he was at the breast.  Tried with the alternate breast; same results.  Reassured family that lactation services will be available on the M/B unit.  Placed baby STS on birth parent's chest where he lay quietly.  Support person at bedside.   Maternal Data    Feeding Mother's Current Feeding Choice: Breast Milk  LATCH Score Latch: Too sleepy or reluctant, no latch achieved, no sucking elicited.  Audible Swallowing: None  Type of Nipple: Everted at rest and after stimulation  Comfort (Breast/Nipple): Soft / non-tender  Hold (Positioning): Assistance needed to correctly position infant at breast and maintain latch.  LATCH Score: 5   Lactation Tools Discussed/Used    Interventions Interventions: Assisted with latch;Skin to skin  Discharge    Consult Status Consult Status: Follow-up from L&D    Rebecca Navarro 07/28/2022, 5:41 PM

## 2022-07-28 NOTE — Progress Notes (Signed)
Rebecca Navarro is a 21 y.o. G1P0 at [redacted]w[redacted]d.  Subjective: Contractions 3/10  Objective: BP (!) 156/89   Pulse 76   Temp (!) 97.3 F (36.3 C) (Oral)   Resp 18   Ht 5\' 5"  (1.651 m)   Wt 96.8 kg   LMP 10/27/2021   SpO2 99%   BMI 35.49 kg/m    FHT:  FHR: 120 bpm, variability: mod,  accelerations: 15x15,  decelerations:  none UC:   Q 2-3 minutes, mild Dilation: 4.5 Effacement (%): 70 Cervical Position: Middle Station: -2 Presentation: Vertex Exam by:: Marlou Porch CNM AROM small amount of clear fluid.  Labs: Results for orders placed or performed during the hospital encounter of 07/27/22 (from the past 24 hour(s))  Comprehensive metabolic panel     Status: Abnormal   Collection Time: 07/28/22  3:29 AM  Result Value Ref Range   Sodium 134 (L) 135 - 145 mmol/L   Potassium 3.8 3.5 - 5.1 mmol/L   Chloride 105 98 - 111 mmol/L   CO2 18 (L) 22 - 32 mmol/L   Glucose, Bld 102 (H) 70 - 99 mg/dL   BUN 13 6 - 20 mg/dL   Creatinine, Ser 0.73 0.44 - 1.00 mg/dL   Calcium 9.0 8.9 - 10.3 mg/dL   Total Protein 6.3 (L) 6.5 - 8.1 g/dL   Albumin 2.8 (L) 3.5 - 5.0 g/dL   AST 22 15 - 41 U/L   ALT 15 0 - 44 U/L   Alkaline Phosphatase 111 38 - 126 U/L   Total Bilirubin 0.6 0.3 - 1.2 mg/dL   GFR, Estimated >60 >60 mL/min   Anion gap 11 5 - 15  CBC     Status: Abnormal   Collection Time: 07/28/22  3:29 AM  Result Value Ref Range   WBC 10.0 4.0 - 10.5 K/uL   RBC 3.99 3.87 - 5.11 MIL/uL   Hemoglobin 11.8 (L) 12.0 - 15.0 g/dL   HCT 35.4 (L) 36.0 - 46.0 %   MCV 88.7 80.0 - 100.0 fL   MCH 29.6 26.0 - 34.0 pg   MCHC 33.3 30.0 - 36.0 g/dL   RDW 12.7 11.5 - 15.5 %   Platelets 260 150 - 400 K/uL   nRBC 0.0 0.0 - 0.2 %  Type and screen [place]     Status: None   Collection Time: 07/28/22  3:29 AM  Result Value Ref Range   ABO/RH(D) O POS    Antibody Screen NEG    Sample Expiration      07/31/2022,2359 Performed at [facility], [address],  [place], [place] [address]   RPR     Status: None   Collection Time: 07/28/22  3:29 AM  Result Value Ref Range   RPR Ser Ql NON REACTIVE NON REACTIVE  Protein / creatinine ratio, urine     Status: None   Collection Time: 07/28/22  9:53 AM  Result Value Ref Range   Creatinine, Urine 56 mg/dL   Total Protein, Urine <6 mg/dL   Protein Creatinine Ratio        0.00 - 0.15 mg/mg[Cre]    Assessment / Plan: [redacted]w[redacted]d week IUP Labor: Early labor, augmented w/ pitocin. Progressing slowly,  now AROM'd   Fetal Wellbeing:  Category I Pain Control:  Comfort measures. Plans IV pain meds. Wants to avoid epidural due to concern about back pain. Discussed that epidural is not a risk factor for chronic low back pain.  Anticipated MOD:  SVD.   Katrinka Blazing, [place], CNM 07/28/2022 9:53 AM

## 2022-07-28 NOTE — MAU Provider Note (Signed)
Chief Complaint:  Headache and Hypertension   Event Date/Time   First Provider Initiated Contact with Patient 07/27/22 2339     HPI: Dawsyn Shondra Capps is a 21 y.o. G1P0 at [redacted]w[redacted]d who presents to maternity admissions reporting headache and increased BP at home, some contractions today but nothing bothersome to her. Concerned about her headache since she has a history of hypertension. Denies visual disturbances, dizziness or epigastric pain. No other physical complaints. Denies vaginal bleeding, leaking of fluid, decreased fetal movement, fever, falls, or recent illness.   Pregnancy Course: Receives care at [facility], prenatal records reviewed.   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   Asthma    Attention deficit    COVID-19 07/2021   Depression    Hypertension    Self-harm 01/14/2016   OB History  Gravida Para Term Preterm AB Living  1            SAB IAB Ectopic Multiple Live Births               # Outcome Date GA Lbr Len/2nd Weight Sex Delivery Anes PTL Lv  1 Current            Past Surgical History:  Procedure Laterality Date   CHOLECYSTECTOMY  08/2021   Family History  Problem Relation Age of Onset   Hypertension Mother    Hypertension Sister    Asthma Brother    Heart disease Maternal Grandmother    Diabetes Maternal Grandmother    Cancer Maternal Grandmother    Stroke Maternal Grandfather    Diabetes Maternal Grandfather    Diabetes Paternal Grandmother    Cancer Paternal Grandmother    Diabetes Paternal Grandfather    Birth defects Neg Hx    Social History   Tobacco Use   Smoking status: Never    Passive exposure: Yes   Smokeless tobacco: Never  Vaping Use   Vaping Use: Some days   Last attempt to quit: 06/03/2022   Substances: Nicotine, Flavoring  Substance Use Topics   Alcohol use: Not Currently   Drug use: Not Currently    Frequency: 7.0 times per week    Types: Marijuana    Comment: last use Jun 04 2022   Allergies  Allergen Reactions   Bee Venom Anaphylaxis   Ibuprofen Hives   Trichophyton Hives and Shortness Of Breath   Diclofenac Hives and Swelling   Nsaids Swelling   Medications Prior to Admission  Medication Sig Dispense Refill Last Dose   prenatal vitamin w/FE, FA (PRENATAL 1 + 1) 27-1 MG TABS tablet Take 1 tablet by mouth daily at 12 noon. 30 tablet 11 07/26/2022   ALLERGY RELIEF 10 MG tablet Take 10 mg by mouth daily.      fluticasone (FLONASE) 50 MCG/ACT nasal spray SMARTSIG:2 Spray(s) Both Nares Every Night      I have reviewed patient's Past Medical Hx, Surgical Hx, Family Hx, Social Hx, medications and allergies.   ROS:  Pertinent items noted in HPI and remainder of comprehensive ROS otherwise negative.   Physical Exam  Patient Vitals for the past 24 hrs:  BP Temp Temp src Pulse Resp SpO2 Height Weight  07/28/22 0031 117/74 -- -- 89 -- -- -- --  07/28/22 0016 123/63 -- -- 69 -- -- -- --  07/28/22 0000 119/73 -- -- 76 -- 99 % -- --  07/27/22 2346 113/65 -- -- 78 -- -- -- --  07/27/22 2330 120/80 -- --  83 -- 100 % -- --  07/27/22 2315 112/72 -- -- 83 -- 99 % -- --  07/27/22 2310 120/71 -- -- 82 -- 97 % -- --  07/27/22 2245 133/86 97.9 F (36.6 C) Oral (!) 113 20 -- 5\' 5"  (1.651 m) 213 lb 4.8 oz (96.8 kg)   Constitutional: Well-developed, well-nourished female in no acute distress.  Cardiovascular: normal rate & rhythm, warm and well-perfused Respiratory: normal effort, no problems with respiration noted GI: Abd soft, non-tender, gravid appropriate for gestational age MS: Extremities nontender, no edema, normal ROM Neurologic: Alert and oriented x 4.  GU: no CVA tenderness Pelvic: exam deferred - initial SVE by RN = 4cm/80/-2  Dilation: 4.5 Effacement (%): 80 Cervical Position: Middle Station: -2 Presentation: Vertex Exam by:: Donzetta Sprung, RN  Fetal Tracing: reactive Baseline: 150 Variability: moderate Accelerations: 15x15 Decelerations:  none Toco: q2-37min but palpate as mild (initially)   Labs: No results found for this or any previous visit (from the past 24 hour(s)).  Imaging:  No results found.  MAU Course: No orders of the defined types were placed in this encounter.  Meds ordered this encounter  Medications   acetaminophen (TYLENOL) tablet 1,000 mg   caffeine tablet 200 mg   MDM: Completely normotensive on arrival to MAU. Tylenol and caffeine ordered for HA which completely relieved pain.  On reassessment for HA, pt reported increase of contractions and requested CE, was 4/80/-2 (had previously been 3cm). Headache resolved but now pt's contractions more regular, desired to stay and be rechecked in an hour.  On recheck, cervix now 4.5/80/-2. Pt offered to ambulate for an hour and recheck or go home and come back when contractions stronger. Pt opted to ambulate.  Recheck = 5/90/-1, will admit to L&D for expectant management. Report given to Gifford Shave, MD.  Assessment: Indication for care in labor and delivery  Plan: See H&P   Gaylan Gerold, CNM, MSN, [facility] Certified Nurse Midwife, [place]

## 2022-07-28 NOTE — H&P (Signed)
OBSTETRIC ADMISSION HISTORY AND PHYSICAL  Rebecca Navarro is a 21 y.o. female G1P0 with IUP at [redacted]w[redacted]d by LMP presenting for spontaneous onset of labor. She reports +FMs, No LOF, no VB, no blurry vision, or peripheral edema, and RUQ pain. Came in with a headache that was responsive to Tylenol/caffeine. She plans on breast feeding. She request POPs for birth control. She received her prenatal care at [facility]  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   Asthma    Attention deficit    COVID-19 07/2021   Depression    Hypertension    Self-harm 01/14/2016   Past Surgical History: Past Surgical History:  Procedure Laterality Date   CHOLECYSTECTOMY  08/2021   Obstetrical History: OB History     Gravida  1   Para      Term      Preterm      AB      Living         SAB      IAB      Ectopic      Multiple      Live Births             Social History Social History   Socioeconomic History   Marital status: Single    Spouse name: Not on file   Number of children: Not on file   Years of education: Not on file   Highest education level: Not on file  Occupational History   Not on file  Tobacco Use   Smoking status: Never    Passive exposure: Yes   Smokeless tobacco: Never  Vaping Use   Vaping Use: Some days   Last attempt to quit: 06/03/2022   Substances: Nicotine, Flavoring  Substance and Sexual Activity   Alcohol use: Not Currently   Drug use: Not Currently    Frequency: 7.0 times per week    Types: Marijuana    Comment: last use Jun 04 2022   Sexual activity: Not Currently    Birth control/protection: None  Other Topics Concern    Not on file  Social History Narrative   Not on file   Social Determinants of Health   Financial Resource Strain: Not on file  Food Insecurity: No Food Insecurity (07/15/2022)   Hunger Vital Sign    Worried About Running Out of Food in the Last Year: Never true    Ran Out of Food in the Last Year: Never true  Transportation Needs: No Transportation Needs (07/15/2022)   PRAPARE - 07/17/2022 (Medical): No    Lack of Transportation (Non-Medical): No  Physical Activity: Not on file  Stress: Not on file  Social Connections: Not on file   Family History: Family History  Problem Relation Age of Onset   Hypertension Mother    Hypertension Sister    Asthma Brother    Heart disease Maternal Grandmother    Diabetes Maternal Grandmother    Cancer Maternal Grandmother    Stroke Maternal Grandfather    Diabetes Maternal Grandfather    Diabetes Paternal Grandmother    Cancer Paternal Grandmother    Diabetes Paternal Grandfather    Birth defects Neg Hx  Allergies: Allergies  Allergen Reactions   Bee Venom Anaphylaxis   Ibuprofen Hives   Trichophyton Hives and Shortness Of Breath   Diclofenac Hives and Swelling   Nsaids Swelling   Medications Prior to Admission  Medication Sig Dispense Refill Last Dose   prenatal vitamin w/FE, FA (PRENATAL 1 + 1) 27-1 MG TABS tablet Take 1 tablet by mouth daily at 12 noon. 30 tablet 11 07/26/2022   ALLERGY RELIEF 10 MG tablet Take 10 mg by mouth daily.      fluticasone (FLONASE) 50 MCG/ACT nasal spray SMARTSIG:2 Spray(s) Both Nares Every Night      Review of Systems  All systems reviewed and negative except as stated in HPI  Blood pressure 117/74, pulse 89, temperature 97.9 F (36.6 C), temperature source Oral, resp. rate 20, height 5\' 5"  (1.651 m), weight 213 lb 4.8 oz (96.8 kg), last menstrual period 10/27/2021, SpO2 99 %. General appearance: alert, cooperative, appears stated age, and no distress Lungs: clear  to auscultation bilaterally Heart: regular rate and rhythm Abdomen: soft, non-tender; bowel sounds normal Pelvic: normal external female genitalia Extremities: Homans sign is negative, no sign of DVT DTR's normal Presentation: cephalic Fetal monitoring: Baseline: 130 bpm, Variability: Good {> 6 bpm), Accelerations: Reactive, and Decelerations: Absent Uterine activity: Date/time of onset: midnight 10/10, Frequency: Every 3-4 minutes, Duration: 60 seconds, and Intensity: moderate Dilation: 5 Effacement (%): 80 Station: -2 Exam by:: Donzetta Sprung, RN  Prenatal labs: ABO, Rh: O/Positive/-- (03/28 1516) Antibody: Negative (03/28 1516) Rubella: 4.93 (03/28 1516) RPR: Non Reactive (07/18 0843)  HBsAg: Negative (03/28 1516)  HIV: Non Reactive (07/18 0843)  GBS: Negative/-- (09/18 1332)  2hr GTT: 75/79/85 (normal) Genetic screening: LR NIPS, carrier screen negative Anatomy US: normal  Prenatal Transfer Tool  Maternal Diabetes: No Genetic Screening: Normal Maternal Ultrasounds/Referrals: Normal Fetal Ultrasounds or other Referrals:  Referred to Materal Fetal Medicine  Maternal Substance Abuse:  Yes:  Type: Marijuana Significant Maternal Medications:  None Significant Maternal Lab Results:  Group B Strep negative Number of Prenatal Visits:greater than 3 verified prenatal visits Other Comments:  None  No results found for this or any previous visit (from the past 24 hour(s)).  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 G1P0 at [redacted]w[redacted]d here for cHTN with spontaneous onset of labor  #Labor: Active, expectant management #Pain: Pt desires IV pain meds only #FWB: Cat 1 #ID:  GBS neg #MOF: Breast #MOC: POPs #Circ:  Yes #cHTN: PEC labs drawn with admission labs  Gabriel Carina, CNM  07/28/2022, 3:14 AM

## 2022-07-28 NOTE — Anesthesia Preprocedure Evaluation (Addendum)
Anesthesia Evaluation  Patient identified by MRN, date of birth, ID band Patient awake    Reviewed: Allergy & Precautions, Patient's Chart, lab work & pertinent test results  Airway Mallampati: II  TM Distance: >3 FB Neck ROM: Full    Dental no notable dental hx.    Pulmonary asthma (childhood, no inhalers) ,    Pulmonary exam normal breath sounds clear to auscultation       Cardiovascular hypertension (cHTN but not hon home meds), Normal cardiovascular exam Rhythm:Regular Rate:Normal     Neuro/Psych PSYCHIATRIC DISORDERS Anxiety Depression negative neurological ROS     GI/Hepatic negative GI ROS, Neg liver ROS,   Endo/Other  Obesity BMI 35  Renal/GU negative Renal ROS  negative genitourinary   Musculoskeletal Scoliosis per pt- doesn't know who diagnosed her, never needed surgery or bracing   On exam, appears to be mostly thoracic    Abdominal (+) + obese,   Peds negative pediatric ROS (+)  Hematology negative hematology ROS (+) Hb 12.4, plt 235   Anesthesia Other Findings   Reproductive/Obstetrics (+) Pregnancy                            Anesthesia Physical Anesthesia Plan  ASA: 2  Anesthesia Plan: Epidural   Post-op Pain Management:    Induction:   PONV Risk Score and Plan: 2  Airway Management Planned: Natural Airway  Additional Equipment: None  Intra-op Plan:   Post-operative Plan:   Informed Consent: I have reviewed the patients History and Physical, chart, labs and discussed the procedure including the risks, benefits and alternatives for the proposed anesthesia with the patient or authorized representative who has indicated his/her understanding and acceptance.       Plan Discussed with:   Anesthesia Plan Comments:         Anesthesia Quick Evaluation

## 2022-07-28 NOTE — Anesthesia Procedure Notes (Signed)
Epidural Patient location during procedure: OB Start time: 07/28/2022 1:27 PM End time: 07/28/2022 1:34 PM  Staffing Anesthesiologist: Pervis Hocking, DO Performed: anesthesiologist   Preanesthetic Checklist Completed: patient identified, IV checked, risks and benefits discussed, monitors and equipment checked, pre-op evaluation and timeout performed  Epidural Patient position: sitting Prep: DuraPrep and site prepped and draped Patient monitoring: continuous pulse ox, blood pressure, heart rate and cardiac monitor Approach: midline Location: L3-L4 Injection technique: LOR air  Needle:  Needle type: Tuohy  Needle gauge: 17 G Needle length: 9 cm Needle insertion depth: 7 cm Catheter type: closed end flexible Catheter size: 19 Gauge Catheter at skin depth: 12 cm Test dose: negative  Assessment Sensory level: T8 Events: blood not aspirated, injection not painful, no injection resistance, no paresthesia and negative IV test  Additional Notes Patient identified. Risks/Benefits/Options discussed with patient including but not limited to bleeding, infection, nerve damage, paralysis, failed block, incomplete pain control, headache, blood pressure changes, nausea, vomiting, reactions to medication both or allergic, itching and postpartum back pain. Confirmed with bedside nurse the patient's most recent platelet count. Confirmed with patient that they are not currently taking any anticoagulation, have any bleeding history or any family history of bleeding disorders. Patient expressed understanding and wished to proceed. All questions were answered. Sterile technique was used throughout the entire procedure. Please see nursing notes for vital signs. Test dose was given through epidural catheter and negative prior to continuing to dose epidural or start infusion. Warning signs of high block given to the patient including shortness of breath, tingling/numbness in hands, complete motor  block, or any concerning symptoms with instructions to call for help. Patient was given instructions on fall risk and not to get out of bed. All questions and concerns addressed with instructions to call with any issues or inadequate analgesia.  Reason for block:procedure for pain

## 2022-07-28 NOTE — Discharge Summary (Signed)
Postpartum Discharge Summary    Patient Name: Rebecca Navarro DOB: Dec 07, 2000 MRN: [id number]  Date of admission: 07/27/2022 Delivery date:07/28/2022  Delivering provider: Manya Silvas  Date of discharge: 07/30/2022  Admitting diagnosis: Indication for care in labor or delivery [O75.9] Intrauterine pregnancy: [redacted]w[redacted]d    Secondary diagnosis:  Principal Problem:   Indication for care in labor or delivery Active Problems:   Chronic hypertension in obstetric context in first trimester   Vaginal delivery  Additional problems: n/a    Discharge diagnosis: Term Pregnancy Delivered and CHTN                                              Post partum procedures: n/a Augmentation: AROM and Pitocin Complications: None  Hospital course: Onset of Labor With Vaginal Delivery      21y.o. yo G1P1001 at 356w1das admitted in Latent Labor on 07/27/2022. [facility]   Membrane Rupture Time/Date: 9:57 AM ,07/28/2022   Delivery Method:Vaginal, Spontaneous  Episiotomy: None  Lacerations:  Vaginal;Labial  Patient had a postpartum course complicated by needing lasix for 5 days gHTN, but no consistently elevated BP noted.  She is ambulating, tolerating a regular diet, passing flatus, and urinating well. Patient is discharged home in stable condition on 07/30/22.  Newborn Data: Birth date:07/28/2022  Birth time:4:41 PM  Gender:Female  Living status:Living  Apgars:8 ,9  Weight:3430 g   Magnesium Sulfate received: No BMZ received: No Rhophylac:N/A MMR:N/A T-DaP:Given prenatally Flu: N/A Transfusion:No  Physical exam  Vitals:   07/29/22 0350 07/29/22 0800 07/29/22 1440 07/29/22 2257  BP: 108/60 130/70 127/69 123/73  Pulse: 89 82 80 84  Resp: 18 18 17 18   Temp: 98 F (36.7 C) 97.9 F (36.6 C) 98.5 F (36.9 C) 98.7 F (37.1 C)  TempSrc: Oral Oral Oral Oral  SpO2: 99% 99%  99%  Weight:      Height:       General: alert,  cooperative, and no distress Lochia: appropriate Uterine Fundus: firm Incision: N/A DVT Evaluation: Calf/Ankle edema is present Labs: Lab Results  Component Value Date   WBC 12.4 (H) 07/29/2022   HGB 9.9 (L) 07/29/2022   HCT 28.6 (L) 07/29/2022   MCV 84.6 07/29/2022   PLT 233 07/29/2022      Latest Ref Rng & Units 07/28/2022    3:29 AM  CMP  Glucose 70 - 99 mg/dL 102   BUN 6 - 20 mg/dL 13   Creatinine 0.44 - 1.00 mg/dL 0.73   Sodium 135 - 145 mmol/L 134   Potassium 3.5 - 5.1 mmol/L 3.8   Chloride 98 - 111 mmol/L 105   CO2 22 - 32 mmol/L 18   Calcium 8.9 - 10.3 mg/dL 9.0   Total Protein 6.5 - 8.1 g/dL 6.3   Total Bilirubin 0.3 - 1.2 mg/dL 0.6   Alkaline Phos 38 - 126 U/L 111   AST 15 - 41 U/L 22   ALT 0 - 44 U/L 15    Edinburgh Score:    07/29/2022   11:34 AM  Edinburgh Postnatal Depression Scale Screening Tool  I have been able to laugh and see the funny side of things. 0  I have looked forward with enjoyment to things. 0  I have blamed myself unnecessarily when things went wrong. 2  I have been  anxious or worried for no good reason. 0  I have felt scared or panicky for no good reason. 0  Things have been getting on top of me. 1  I have been so unhappy that I have had difficulty sleeping. 0  I have felt sad or miserable. 0  I have been so unhappy that I have been crying. 1  The thought of harming myself has occurred to me. 0  Edinburgh Postnatal Depression Scale Total 4     After visit meds:  Allergies as of 07/30/2022       Reactions   Bee Venom Anaphylaxis   Ibuprofen Hives   Trichophyton Hives, Shortness Of Breath   Diclofenac Hives, Swelling   Nsaids Swelling        Medication List     TAKE these medications    acetaminophen 325 MG tablet Commonly known as: Tylenol Take 2 tablets (650 mg total) by mouth every 4 (four) hours as needed (for pain scale < 4).   Allergy Relief 10 MG tablet Generic drug: loratadine Take 10 mg by mouth daily.    fluticasone 50 MCG/ACT nasal spray Commonly known as: FLONASE SMARTSIG:2 Spray(s) Both Nares Every Night   furosemide 20 MG tablet Commonly known as: LASIX Take 1 tablet (20 mg total) by mouth daily for 3 days.   prenatal vitamin w/FE, FA 27-1 MG Tabs tablet Take 1 tablet by mouth daily at 12 noon.   senna-docusate 8.6-50 MG tablet Commonly known as: Senokot-S Take 2 tablets by mouth daily.   Slynd 4 MG Tabs Generic drug: Drospirenone Take 1 tablet by mouth daily for 28 days.         Discharge home in stable condition Infant Feeding: Bottle and Breast Infant Disposition:home with mother Discharge instruction: per After Visit Summary and Postpartum booklet. Activity: Advance as tolerated. Pelvic rest for 6 weeks.  Diet: routine diet Future Appointments: Future Appointments  Date Time Provider [place]  08/03/2022 10:30 AM Caren Macadam, MD [facility]  08/28/2022 11:15 AM Clarnce Flock, MD [facility]   Follow up Visit:  Please schedule this patient for a In person postpartum visit in 6 weeks with the following provider: Any provider. Additional Postpartum F/U:Postpartum Depression checkup in 2 weeks and BP check 2-3 days  High risk pregnancy complicated by: HTN Delivery mode:  Vaginal, Spontaneous  Anticipated Birth Control:  POPs   07/30/2022 Shelda Pal, DO

## 2022-07-29 ENCOUNTER — Other Ambulatory Visit: Payer: Medicaid Other

## 2022-07-29 ENCOUNTER — Encounter: Payer: Self-pay | Admitting: Family Medicine

## 2022-07-29 LAB — CBC
HCT: 28.6 % — ABNORMAL LOW (ref 36.0–46.0)
Hemoglobin: 9.9 g/dL — ABNORMAL LOW (ref 12.0–15.0)
MCH: 29.3 pg (ref 26.0–34.0)
MCHC: 34.6 g/dL (ref 30.0–36.0)
MCV: 84.6 fL (ref 80.0–100.0)
Platelets: 233 10*3/uL (ref 150–400)
RBC: 3.38 MIL/uL — ABNORMAL LOW (ref 3.87–5.11)
RDW: 12.8 % (ref 11.5–15.5)
WBC: 12.4 10*3/uL — ABNORMAL HIGH (ref 4.0–10.5)
nRBC: 0 % (ref 0.0–0.2)

## 2022-07-29 MED ORDER — CYCLOBENZAPRINE HCL 10 MG PO TABS
10.0000 mg | ORAL_TABLET | Freq: Three times a day (TID) | ORAL | Status: DC | PRN
  Administered 2022-07-29: 10 mg via ORAL
  Filled 2022-07-29: qty 1

## 2022-07-29 NOTE — Anesthesia Postprocedure Evaluation (Signed)
Anesthesia Post Note  Patient: Rebecca Navarro  Procedure(s) Performed: AN AD HOC LABOR EPIDURAL     Patient location during evaluation: Mother Baby Anesthesia Type: Epidural Level of consciousness: awake and alert Pain management: pain level controlled Vital Signs Assessment: post-procedure vital signs reviewed and stable Respiratory status: spontaneous breathing, nonlabored ventilation and respiratory function stable Cardiovascular status: stable Postop Assessment: no headache, no backache and epidural receding Anesthetic complications: no Comments: Patient complained of soreness at insertion site of epidural, occasional sharp pain with no radiation to buttocks or legs.  Reassured and told to have nurse inform Anesthesia if gets worse.  Up and  without pain or difficulty.   No notable events documented.  Last Vitals:  Vitals:   07/29/22 0350 07/29/22 0800  BP: 108/60 130/70  Pulse: 89 82  Resp: 18 18  Temp: 36.7 C 36.6 C  SpO2: 99% 99%    Last Pain:  Vitals:   07/29/22 0800  TempSrc: Oral  PainSc: 3    Pain Goal: Patients Stated Pain Goal: 2 (07/29/22 0350)                 Nonah Mattes N

## 2022-07-29 NOTE — Progress Notes (Signed)
Post Partum Day 1  Subjective: no complaints, up ad lib, voiding, and tolerating PO. No BM.  Objective: Blood pressure 130/70, pulse 82, temperature 97.9 F (36.6 C), temperature source Oral, resp. rate 18, height 5\' 5"  (1.651 m), weight 96.8 kg, last menstrual period 10/27/2021, SpO2 99 %, unknown if currently breastfeeding.  Physical Exam:  General: alert and cooperative Lochia: appropriate Uterine Fundus: firm DVT Evaluation: No evidence of DVT seen on physical exam. Negative Homan's sign. No cords or calf tenderness. No significant calf/ankle edema.  Recent Labs    07/28/22 1144 07/29/22 0522  HGB 12.4 9.9*  HCT 37.6 28.6*    Assessment/Plan: Plan for discharge tomorrow, Breastfeeding, Lactation consult, and Contraception POP. Requests outpatient circ with Dr. Dione Plover.    LOS: 1 day   Cecilio Asper, MD 07/29/2022, 10:01 AM

## 2022-07-29 NOTE — Lactation Note (Signed)
This note was copied from a baby's chart. Lactation Consultation Note  Patient Name: Rebecca Navarro PYKDX'[id number] Date: 07/29/2022 Reason for consult: Follow-up assessment;1st time breastfeeding;Term;Infant weight loss (-4% weight loss) Age:20 hours, -4% weight loss, P1, term female infant. Infant had 5 stools and 3 voids since delivery  Birth Parent attempted to latch infant at the breast but infant was to sleepy to breastfeed at this time. LC discussed hand expression to give infant back volume, Birth Parent already knew how to hand express was taught in her [place] BF class, Birth Parent self expressed 5 mls of colostrum that was spoon fed to infant. LC gave Birth Parent a hand pump with 21 breast flange, Birth Parent will continue to work towards latching infant at the breast according to hunger cues, 8 to 12+ times within 24 hours, STS, Birth Parent knows if infant doesn't latch to use hand pump or hand express and give infant back her EBM. Birth Parent knows to call [place] if she has breastfeeding questions or would like further latch assistance, Birth Parent knows to wake infant to BF  do STS, if going past 31/2 hours  without feeding or ask for BF  help RN/LC.   Maternal Data    Feeding Mother's Current Feeding Choice: Breast Milk  LATCH Score Latch: Too sleepy or reluctant, no latch achieved, no sucking elicited.  Audible Swallowing: None  Type of Nipple: Everted at rest and after stimulation  Comfort (Breast/Nipple): Soft / non-tender  Hold (Positioning): Assistance needed to correctly position infant at breast and maintain latch.  LATCH Score: 5   Lactation Tools Discussed/Used Tools: Pump Breast pump type: Manual Pump Education: Milk Storage Reason for Pumping: prn use for home Pumping frequency: prn home use  Interventions Interventions: Skin to skin;Assisted with latch;Hand express;Breast compression;Adjust position;Support pillows;Position options;Expressed  milk;Education  Discharge    Consult Status Consult Status: Follow-up Date: 07/30/22 Follow-up type: In-patient    Vicente Serene 07/29/2022, 2:39 PM

## 2022-07-29 NOTE — Progress Notes (Signed)
Post Partum Day 1 Subjective: Eating, drinking, voiding, ambulating well.  +flatus.  Lochia and pain wnl.  Denies dizziness, lightheadedness, or sob. No complaints. Some lower back pain.   Objective: Blood pressure 130/70, pulse 82, temperature 97.9 F (36.6 C), temperature source Oral, resp. rate 18, height 5\' 5"  (1.651 m), weight 96.8 kg, last menstrual period 10/27/2021, SpO2 99 %, unknown if currently breastfeeding.  Physical Exam:  General: alert, cooperative, and no distress Lochia: appropriate Uterine Fundus: firm Incision: n/a DVT Evaluation: No evidence of DVT seen on physical exam. Negative Homan's sign. No cords or calf tenderness. No significant calf/ankle edema.  Recent Labs    07/28/22 1144 07/29/22 0522  HGB 12.4 9.9*  HCT 37.6 28.6*    Assessment/Plan: Plan for discharge tomorrow, Breastfeeding, Lactation consult, and Contraception POPs . Wants outpatient circumcision   LOS: 1 day   Roma Schanz, CNM 07/29/2022, 8:57 AM

## 2022-07-29 NOTE — Lactation Note (Signed)
This note was copied from a baby's chart. Lactation Consultation Note  Patient Name: Rebecca Navarro GGYIR'S Date: 07/29/2022 Reason for consult: Initial assessment;Primapara;Term Age:20 hours Mom had baby latched when LC came into rm. Mom BF in cradle position. Mom not able to have control over baby's head for latching.  Discussed position options, support, STS, I&O, feeding habits, behavior. Mom encouraged to feed baby 8-12 times/24 hours and with feeding cues.   Encouraged mom to call for assistance as needed. Praised mom for BF so good.  Maternal Data Has patient been taught Hand Expression?: Yes Does the patient have breastfeeding experience prior to this delivery?: No  Feeding    LATCH Score Latch: Grasps breast easily, tongue down, lips flanged, rhythmical sucking.  Audible Swallowing: A few with stimulation  Type of Nipple: Everted at rest and after stimulation  Comfort (Breast/Nipple): Soft / non-tender  Hold (Positioning): Assistance needed to correctly position infant at breast and maintain latch.  LATCH Score: 8   Lactation Tools Discussed/Used    Interventions Interventions: Breast feeding basics reviewed;Adjust position;Assisted with latch;Support pillows;Skin to skin;Position options;Breast Armed forces technical officer  Discharge [place] Program: Yes  Consult Status Consult Status: Follow-up Date: 07/29/22 Follow-up type: In-patient    Theodoro Kalata 07/29/2022, 12:29 AM

## 2022-07-29 NOTE — Clinical Social Work Maternal (Signed)
CLINICAL SOCIAL WORK MATERNAL/CHILD NOTE   Patient Details  Name: Rebecca Navarro MRN: [id number] Date of Birth: 03/14/2001   Date:  07/29/2022   Clinical Social Worker Initiating Note:  Jalen Daluz, LCSWA   Date/Time: Initiated:  07/29/22/1549              Child's Name:  Rebecca Navarro    Biological Parents:  Mother, Father ([place]becca Navarro 08/06/2002, [place] 02/14/1998)    Need for Interpreter:  None    Reason for Referral:  Current Substance Use/Substance Use During Pregnancy      Address:  [address] [place] [address]    Phone number:  [phone] (home)      Additional phone number:    Household Members/Support Persons (HM/SP):   Household Member/Support Person 1     HM/SP Name Relationship DOB or Age  HM/SP -1 [place] Significant other 02/14/1998  HM/SP -2     HM/SP -3     HM/SP -4     HM/SP -5     HM/SP -6     HM/SP -7     HM/SP -8         Natural Supports (not living in the home):  Parent    Professional Supports:      Employment: Full-time    Type of Work:      Education:  High school graduate    Homebound arranged:     Financial Resources:       Other Resources:  Food Stamps  , WIC    Cultural/Religious Considerations Which May Impact Care:     Strengths:  Ability to meet basic needs  , Pediatrician chosen, Home prepared for child      Psychotropic Medications:          Pediatrician:    [place] area   Pediatrician List:    [place] Other ([facility])  [place]    [place]   [place]   [place]   [place]       Pediatrician Fax Number:     Risk Factors/Current Problems:  Substance Use      Cognitive State:  Able to Concentrate  , Alert      Mood/Affect:  Calm  , Apprehensive      CSW Assessment: CSW entered the room, introduced self, CSW role and reason for visit. CSW offered privacy, MOB granted CSW permission to speak in front of FOB and her  friend. CSW inquired about how MOB was feeling, MOB reported she was good and stated her delivery "was an experience". CSW congratulated MOB on infant Rebecca. CSW inquired about MOB's MH hx. MOB reported she was diagnosed with anxiety and depression when she was in 8th grade, MOB reported she was in medication at the time but has not been on anything since. CSW inquired about therapy, MOB reported she was in there until 10th grade but none recently. CSW inquired about how MOB copes with anxiety and depression, MOB reported she has not had any symptoms recently. CSW assessed for safety, MOB denied any SI or HI. MOB identified FOB and her parents as her supports. CSW provided education regarding the baby blues period vs. perinatal mood disorders, discussed treatment and gave resources for mental health follow up if concerns arise.  CSW recommends self-evaluation during the postpartum time period using the New Mom Checklist from Postpartum Progress and encouraged MOB to contact a medical professional if symptoms are noted at any time.   for the infant including a bassinet for him to sleep.   CSW inquired about MOB THC use, MOB reported she last used in August. MOB reported she was using to reduce the nausea and to eat. CSW explained the hospital drug screen policy, MOB voiced understanding. CSW informed MOB infant UDS was negate but if the CDS is positive for any substances a CPS report would be made. MOB voiced understanding.   CSW Plan/Description:  No Further Intervention Required/No Barriers to Discharge, Sudden Infant Death Syndrome (SIDS) Education, Perinatal Mood and Anxiety Disorder (PMADs) Education, Hospital Drug Screen Policy Information, CSW Will Continue to Monitor Umbilical Cord Tissue Drug Screen Results and Make Report if Warranted, Other Information/Referral to [employer],  [place] 07/29/2022, 3:57 PM

## 2022-07-30 ENCOUNTER — Encounter (HOSPITAL_COMMUNITY): Payer: Self-pay | Admitting: Family Medicine

## 2022-07-30 ENCOUNTER — Inpatient Hospital Stay (HOSPITAL_COMMUNITY): Payer: Medicaid Other

## 2022-07-30 ENCOUNTER — Other Ambulatory Visit (HOSPITAL_COMMUNITY): Payer: Self-pay

## 2022-07-30 ENCOUNTER — Inpatient Hospital Stay (HOSPITAL_COMMUNITY): Admission: AD | Admit: 2022-07-30 | Payer: Medicaid Other | Source: Home / Self Care | Admitting: Family Medicine

## 2022-07-30 MED ORDER — ACETAMINOPHEN 325 MG PO TABS
650.0000 mg | ORAL_TABLET | ORAL | 0 refills | Status: DC | PRN
  Filled 2022-07-30: qty 30, 3d supply, fill #0

## 2022-07-30 MED ORDER — SLYND 4 MG PO TABS
1.0000 | ORAL_TABLET | Freq: Every day | ORAL | 12 refills | Status: AC
  Filled 2022-07-30: qty 28, 28d supply, fill #0

## 2022-07-30 MED ORDER — FUROSEMIDE 20 MG PO TABS
20.0000 mg | ORAL_TABLET | Freq: Every day | ORAL | 0 refills | Status: DC
  Filled 2022-07-30: qty 3, 3d supply, fill #0

## 2022-07-30 MED ORDER — SENNOSIDES-DOCUSATE SODIUM 8.6-50 MG PO TABS
2.0000 | ORAL_TABLET | Freq: Every day | ORAL | 0 refills | Status: DC
  Filled 2022-07-30: qty 30, 15d supply, fill #0

## 2022-07-30 NOTE — Lactation Note (Signed)
This note was copied from a baby's chart. Lactation Consultation Note  Patient Name: Rebecca Navarro KMMNO'T Date: 07/30/2022 Reason for consult: Follow-up assessment;Primapara;1st time breastfeeding;Term;Infant weight loss Age:20 hours  LC in to visit with P68 Mom of term baby on day of discharge.  Infant is at an 8% weight loss and having good output.  Baby sleeping in crib.  Baby last fed for 8 mins 4 1/2 hrs prior.  Talked about waking baby if he has been sleeping 3 hrs, until he has regained his birth weight.  LC changed a small transitional stool and baby immediately started cueing.  Mom using cross cradle hold, with pillow support.  LC recommended supporting her breast in a U hold to sandwich breast and accommodate a deeper latch.  Baby latched easily and deeply with flanged lips.  Taught Mom to use alternate breast compression to increase milk transfer.  Baby noted to be swallowing regularly.    Encouraged continued STS and offering the breast often with feeding cues.  If baby is sleeping 3 hrs, recommended changing his diaper and offering the breast STS.   Engorgement prevention and treatment reviewed. Mom aware of OP lactation support available to her.  Mom agreeable to OP lactation F/U and message sent to S. [facility] at [facility].  LATCH Score Latch: Grasps breast easily, tongue down, lips flanged, rhythmical sucking.  Audible Swallowing: Spontaneous and intermittent  Type of Nipple: Everted at rest and after stimulation  Comfort (Breast/Nipple): Soft / non-tender  Hold (Positioning): Assistance needed to correctly position infant at breast and maintain latch. (assisted with a U hold support of breast)  LATCH Score: 9   Lactation Tools Discussed/Used Tools: Pump Breast pump type: Manual Reason for Pumping: Mom's request  Interventions Interventions: Breast feeding basics reviewed;Assisted with latch;Skin to skin;Hand express;Breast massage;Adjust  position;Support pillows;Position options;Breast compression;Hand pump  Discharge Discharge Education: Engorgement and breast care;Warning signs for feeding baby;Outpatient recommendation;Outpatient Epic message sent Pump: Manual  Consult Status Consult Status: Complete Date: 07/30/22 Follow-up type: [place] 07/30/2022, 8:52 AM

## 2022-08-03 ENCOUNTER — Other Ambulatory Visit: Payer: Self-pay

## 2022-08-03 ENCOUNTER — Encounter: Payer: Self-pay | Admitting: Family Medicine

## 2022-08-03 ENCOUNTER — Ambulatory Visit (INDEPENDENT_AMBULATORY_CARE_PROVIDER_SITE_OTHER): Payer: Medicaid Other | Admitting: Family Medicine

## 2022-08-03 VITALS — BP 128/83 | HR 99

## 2022-08-03 DIAGNOSIS — O1093 Unspecified pre-existing hypertension complicating the puerperium: Secondary | ICD-10-CM

## 2022-08-03 DIAGNOSIS — O10913 Unspecified pre-existing hypertension complicating pregnancy, third trimester: Secondary | ICD-10-CM

## 2022-08-03 NOTE — Progress Notes (Signed)
   GYNECOLOGY PROBLEM  VISIT ENCOUNTER NOTE  Subjective:   Rebecca Navarro is a 21 y.o. G67P1001 female here for a problem GYN visit.   Chief Complaint  Patient presents with   Blood Pressure Check    Patient had cHTN in pregnancy with elevation postpartum Sent home on Lasix x5 days  Gynecologic History No LMP recorded (lmp unknown).  Contraception: none- planning on POPs  Health Maintenance Due  Topic Date Due   INFLUENZA VACCINE  05/19/2022    The following portions of the patient's history were reviewed and updated as appropriate: allergies, current medications, past family history, past medical history, past social history, past surgical history and problem list.  Review of Systems Pertinent items are noted in HPI.   Objective:  BP 128/83   Pulse 99   LMP  (LMP Unknown)   Breastfeeding Yes  Gen: well appearing, NAD HEENT: no scleral icterus CV: RR Lung: Normal WOB Ext: warm well perfused  Assessment and Plan:  1. Chronic hypertension in obstetric context in third trimester BP WNL Reviewed typical postpartum sx Has lactation visit on Friday  Please refer to After Visit Summary for other counseling recommendations.   Return in about 1 week (around 08/10/2022).  Caren Macadam, MD, MPH, ABFM Attending [place] for [facility]

## 2022-08-05 ENCOUNTER — Other Ambulatory Visit: Payer: Self-pay

## 2022-08-05 ENCOUNTER — Encounter: Payer: Self-pay | Admitting: Family Medicine

## 2022-08-06 ENCOUNTER — Encounter: Payer: Self-pay | Admitting: General Practice

## 2022-08-13 ENCOUNTER — Encounter: Payer: Self-pay | Admitting: Family Medicine

## 2022-08-27 NOTE — Progress Notes (Signed)
Post Partum Visit Note  Rebecca Navarro is a 21 y.o. G25P1001 female who presents for a postpartum visit. She is 4 weeks postpartum following a normal spontaneous vaginal delivery.  I have fully reviewed the prenatal and intrapartum course. The delivery was at 39.1 gestational weeks.  Anesthesia: epidural. Postpartum course has been uneventful. Baby is doing well. Baby is feeding by breast. Bleeding staining only. Bowel function is normal. Bladder function is normal. Patient is not sexually active. Contraception method is oral progesterone-only contraceptive. Postpartum depression screening: negative.   The pregnancy intention screening data noted above was reviewed. Potential methods of contraception were discussed. The patient elected to proceed with No data recorded.   Edinburgh Postnatal Depression Scale - 08/28/22 1118       Edinburgh Postnatal Depression Scale:  In the Past 7 Days   I have been able to laugh and see the funny side of things. 0    I have looked forward with enjoyment to things. 0    I have blamed myself unnecessarily when things went wrong. 0    I have been anxious or worried for no good reason. 0    I have felt scared or panicky for no good reason. 0    Things have been getting on top of me. 0    I have been so unhappy that I have had difficulty sleeping. 0    I have felt sad or miserable. 0    I have been so unhappy that I have been crying. 0    The thought of harming myself has occurred to me. 0    Edinburgh Postnatal Depression Scale Total 0             Health Maintenance Due  Topic Date Due   INFLUENZA VACCINE  05/19/2022    The following portions of the patient's history were reviewed and updated as appropriate: allergies, current medications, past family history, past medical history, past social history, past surgical history, and problem list.  Review of Systems Pertinent items noted in HPI and remainder of comprehensive ROS otherwise  negative.  Objective:  BP 126/76   Pulse 88   Wt 187 lb 12.8 oz (85.2 kg)   LMP  (LMP Unknown)   BMI 31.25 kg/m    General:  alert, cooperative, and appears stated age   Breasts:  not indicated  Lungs: Comfortalbe on room air  Wound N/a  GU exam:  not indicated         Assessment:    There are no diagnoses linked to this encounter.  Normal postpartum exam.   Plan:   Essential components of care per ACOG recommendations:  1.  Mood and well being: Patient with negative depression screening today. Reviewed local resources for support.  - Patient tobacco use? No.   - hx of drug use? No.    2. Infant care and feeding:  -Patient currently breastmilk feeding? Yes. Reviewed importance of draining breast regularly to support lactation.  -Social determinants of health (SDOH) reviewed in EPIC. No concerns  3. Sexuality, contraception and birth spacing - Patient does not want a pregnancy in the next year.  Desired family size is unsure number of children.  - Reviewed reproductive life planning. Reviewed contraceptive methods based on pt preferences and effectiveness.  Patient desired Oral Contraceptive today.   - Discussed birth spacing of 18 months  4. Sleep and fatigue -Encouraged family/partner/community support of 4 hrs of uninterrupted sleep to help with mood  and fatigue  5. Physical Recovery  - Discussed patients delivery and complications. She describes her labor as mixed. - Patient had a Vaginal, no problems at delivery. Patient had a 1st degree laceration. Perineal healing reviewed. Patient expressed understanding - Patient has urinary incontinence? No. - Patient is safe to resume physical and sexual activity  6.  Health Maintenance - HM due items addressed No - up to date - Last pap smear No results found for: "DIAGPAP" Pap smear not done at today's visit.  -Breast Cancer screening indicated? No.   7. Chronic Disease/Pregnancy Condition follow up:  Hypertension Normotensive, monitor frequently - PCP follow up  Center for [employer], [facility]

## 2022-08-28 ENCOUNTER — Ambulatory Visit (INDEPENDENT_AMBULATORY_CARE_PROVIDER_SITE_OTHER): Payer: Medicaid Other | Admitting: Family Medicine

## 2022-08-28 ENCOUNTER — Encounter: Payer: Self-pay | Admitting: Family Medicine

## 2022-08-28 DIAGNOSIS — F419 Anxiety disorder, unspecified: Secondary | ICD-10-CM

## 2022-08-28 DIAGNOSIS — F32A Depression, unspecified: Secondary | ICD-10-CM | POA: Diagnosis not present

## 2022-08-28 DIAGNOSIS — O10911 Unspecified pre-existing hypertension complicating pregnancy, first trimester: Secondary | ICD-10-CM

## 2022-09-01 ENCOUNTER — Other Ambulatory Visit: Payer: Self-pay

## 2022-09-01 MED ORDER — SLYND 4 MG PO TABS: 1.0000 | ORAL_TABLET | Freq: Every day | ORAL | 12 refills | Status: AC

## 2022-09-01 MED ORDER — NYSTATIN 100000 UNIT/GM EX CREA: 1.0000 | TOPICAL_CREAM | Freq: Two times a day (BID) | CUTANEOUS | 0 refills | Status: DC

## 2022-09-01 NOTE — Progress Notes (Signed)
Rx Nystatin cream to pharmacy for thrush/breast yeast infection.  Aleyda Gindlesperger,RNC

## 2022-09-01 NOTE — Progress Notes (Signed)
Rx refill for birth control sent to pharmacy on file.  Judeth Cornfield, RNC

## 2022-09-18 ENCOUNTER — Encounter: Payer: Self-pay | Admitting: Family Medicine

## 2022-09-18 DIAGNOSIS — O9122 Nonpurulent mastitis associated with the puerperium: Secondary | ICD-10-CM

## 2022-09-18 MED ORDER — DICLOXACILLIN SODIUM 500 MG PO CAPS: 500.0000 mg | ORAL_CAPSULE | Freq: Four times a day (QID) | ORAL | 0 refills | Status: DC

## 2022-09-29 ENCOUNTER — Encounter: Payer: Self-pay | Admitting: Family Medicine

## 2023-04-24 ENCOUNTER — Emergency Department (HOSPITAL_COMMUNITY)
Admission: EM | Admit: 2023-04-24 | Discharge: 2023-04-25 | Disposition: A | Discharge status: Home / Self Care | Payer: Medicaid Other | Attending: Emergency Medicine | Admitting: Emergency Medicine

## 2023-04-24 ENCOUNTER — Other Ambulatory Visit: Payer: Self-pay

## 2023-04-24 ENCOUNTER — Emergency Department (HOSPITAL_COMMUNITY): Payer: Medicaid Other

## 2023-04-24 DIAGNOSIS — R0781 Pleurodynia: Secondary | ICD-10-CM | POA: Insufficient documentation

## 2023-04-24 DIAGNOSIS — M546 Pain in thoracic spine: Secondary | ICD-10-CM | POA: Insufficient documentation

## 2023-04-24 DIAGNOSIS — R079 Chest pain, unspecified: Secondary | ICD-10-CM | POA: Diagnosis present

## 2023-04-24 DIAGNOSIS — J45909 Unspecified asthma, uncomplicated: Secondary | ICD-10-CM | POA: Diagnosis not present

## 2023-04-24 LAB — CBC
HCT: 42.4 % (ref 36.0–46.0)
Hemoglobin: 13.9 g/dL (ref 12.0–15.0)
MCH: 27.3 pg (ref 26.0–34.0)
MCHC: 32.8 g/dL (ref 30.0–36.0)
MCV: 83.3 fL (ref 80.0–100.0)
Platelets: 287 10*3/uL (ref 150–400)
RBC: 5.09 MIL/uL (ref 3.87–5.11)
RDW: 13.5 % (ref 11.5–15.5)
WBC: 6.4 10*3/uL (ref 4.0–10.5)
nRBC: 0 % (ref 0.0–0.2)

## 2023-04-24 LAB — BASIC METABOLIC PANEL
Anion gap: 11 (ref 5–15)
BUN: 11 mg/dL (ref 6–20)
CO2: 22 mmol/L (ref 22–32)
Calcium: 9.2 mg/dL (ref 8.9–10.3)
Chloride: 104 mmol/L (ref 98–111)
Creatinine, Ser: 1 mg/dL (ref 0.44–1.00)
GFR, Estimated: >60 mL/min (ref >60)
Glucose, Bld: 95 mg/dL (ref 70–99)
Potassium: 3.5 mmol/L (ref 3.5–5.1)
Sodium: 137 mmol/L (ref 135–145)

## 2023-04-24 LAB — HCG, SERUM, QUALITATIVE: Preg, Serum: NEGATIVE

## 2023-04-24 LAB — TROPONIN I (HIGH SENSITIVITY): Troponin I (High Sensitivity): 3 ng/L (ref <18)

## 2023-04-24 MED ORDER — ACETAMINOPHEN 325 MG PO TABS
650.0000 mg | ORAL_TABLET | Freq: Once | ORAL | Status: AC
  Administered 2023-04-25: 650 mg via ORAL
  Filled 2023-04-24: qty 2

## 2023-04-24 MED ORDER — LIDOCAINE 5 % EX PTCH
1.0000 | MEDICATED_PATCH | Freq: Once | CUTANEOUS | Status: DC
  Administered 2023-04-24: 1 via TRANSDERMAL
  Filled 2023-04-24: qty 1

## 2023-04-24 MED ORDER — ACETAMINOPHEN 325 MG PO TABS: 650.0000 mg | ORAL_TABLET | Freq: Four times a day (QID) | ORAL | 0 refills | Status: AC | PRN

## 2023-04-24 NOTE — ED Notes (Signed)
Pt states pain got worse as she walked over to room. Pain goes down below sternum and all over chest

## 2023-04-24 NOTE — Discharge Instructions (Addendum)
As discussed, your pain is most likely due to inflammation of your chest wall.  You can take Tylenol 625 mg as needed every 4 hours for pain.  You can also try applying ice to the chest for pain relief.  Call your primary care provider within the next 48 hours to make an appointment to follow-up with next week for reevaluation of symptoms.  Get help right away if: You feel sick to your stomach (nauseous) or you throw up (vomit). You feel sweaty or light-headed. You have a cough with mucus from your lungs (sputum) or you cough up blood. You are short of breath.

## 2023-04-24 NOTE — ED Notes (Signed)
Pt states pain also goes to her only her left side

## 2023-04-24 NOTE — ED Triage Notes (Signed)
Patient reports pain across central chest and upper back pain with mild SOB , pain radiating to upper back , denies emesis or diaphoresis , pain increases with movement /deep inspiration .

## 2023-04-24 NOTE — ED Provider Notes (Signed)
[place] EMERGENCY DEPARTMENT AT [facility] Provider Note   CSN: [id number] Arrival date & time: 04/24/23  2952     History  Chief Complaint  Patient presents with   Chest Pain    Rebecca Navarro is a 22 y.o. female with a history asthma and depression who presents to the ED today with chest pain.  The pain spreads across to her chest and back.  Pain is worse with deep breaths and with movement.       Home Medications Prior to Admission medications   Medication Sig Start Date End Date Taking? Authorizing Provider  acetaminophen (TYLENOL) 325 MG tablet Take 2 tablets (650 mg total) by mouth every 6 (six) hours as needed for up to 14 days for moderate pain. 04/24/23 05/08/23 Yes Maxwell Marion, PA-C  dicloxacillin (DYNAPEN) 500 MG capsule Take 1 capsule (500 mg total) by mouth 4 (four) times daily. 09/18/22   Venora Maples, MD  Drospirenone (SLYND) 4 MG TABS Take 1 tablet by mouth daily. 09/01/22 09/02/23  Venora Maples, MD  nystatin cream (MYCOSTATIN) Apply 1 Application topically 2 (two) times daily. 09/01/22   Venora Maples, MD  prenatal vitamin w/FE, FA (PRENATAL 1 + 1) 27-1 MG TABS tablet Take 1 tablet by mouth daily at 12 noon. 12/25/21   Venora Maples, MD      Allergies    Bee venom, Ibuprofen, Trichophyton, Diclofenac, and Nsaids    Review of Systems   Review of Systems  Cardiovascular:  Positive for chest pain.  Musculoskeletal:  Positive for back pain.  All other systems reviewed and are negative.   Physical Exam Updated Vital Signs BP 131/84   Pulse (!) 118   Temp 98.2 F (36.8 C) (Oral)   Resp 20   LMP 04/06/2023   SpO2 97%  Physical Exam Vitals and nursing note reviewed.  Constitutional:      Appearance: Normal appearance.  HENT:     Head: Normocephalic and atraumatic.     Mouth/Throat:     Mouth: Mucous membranes are moist.  Eyes:     Conjunctiva/sclera: Conjunctivae normal.     Pupils: Pupils are equal, round,  and reactive to light.  Cardiovascular:     Rate and Rhythm: Normal rate and regular rhythm.     Pulses: Normal pulses.     Heart sounds: Normal heart sounds.  Pulmonary:     Effort: Pulmonary effort is normal.     Breath sounds: Normal breath sounds.  Abdominal:     Palpations: Abdomen is soft.     Tenderness: There is no abdominal tenderness.  Musculoskeletal:     Right lower leg: No edema.     Left lower leg: No edema.  Skin:    General: Skin is warm and dry.     Capillary Refill: Capillary refill takes less than 2 seconds.     Findings: No rash.  Neurological:     General: No focal deficit present.     Mental Status: She is alert.  Psychiatric:        Mood and Affect: Mood normal.        Behavior: Behavior normal.     ED Results / Procedures / Treatments   Labs (all labs ordered are listed, but only abnormal results are displayed) Labs Reviewed  BASIC METABOLIC PANEL  CBC  HCG, SERUM, QUALITATIVE  TROPONIN I (HIGH SENSITIVITY)  TROPONIN I (HIGH SENSITIVITY)    EKG EKG Interpretation Date/Time:  Saturday  April 24 2023 18:55:08 EDT Ventricular Rate:  118 PR Interval:  114 QRS Duration:  86 QT Interval:  326 QTC Calculation: 456 R Axis:   87  Text Interpretation: Sinus tachycardia nonspecific T waves diffusely Confirmed by Pricilla Loveless [phone]) on 04/24/2023 8:16:11 PM  Radiology DG Chest 2 View  Result Date: 04/24/2023 CLINICAL DATA:  Chest pain. EXAM: CHEST - 2 VIEW COMPARISON:  02/11/2018 FINDINGS: The cardiomediastinal contours are normal. The lungs are clear. Pulmonary vasculature is normal. No consolidation, pleural effusion, or pneumothorax. No acute osseous abnormalities are seen. IMPRESSION: Negative radiographs of the chest. Electronically Signed   By: Narda Rutherford M.D.   On: 04/24/2023 19:32    Procedures Procedures: not indicated.   Medications Ordered in ED Medications  lidocaine (LIDODERM) 5 % 1 patch (1 patch Transdermal Patch Applied  04/24/23 2302)  acetaminophen (TYLENOL) tablet 650 mg (has no administration in time range)    ED Course/ Medical Decision Making/ A&P                             Medical Decision Making Amount and/or Complexity of Data Reviewed Labs: ordered. Radiology: ordered.  Risk Prescription drug management.   Patient is a 22 year old female presents the ED today for chest pain since this morning.  The pain is across her whole chest and radiates to her back.  The pain is worse with movement and deep inspiration.  She has tried low-dose Tylenol without any relief of symptoms.  Differentials include: ACS, pneumonia, pneumothorax, pleurisy, muscle strain, etc. Social determinants of health include: Housing, social connection, access to healthcare.  On physical exam, patient reports tenderness to palpation of her chest wall and upper back.  Regular rate and rhythm on cardiac auscultation.  Lungs clear bilaterally.  Distal pulses intact bilaterally of upper and lower extremities.  No weakness or loss of sensation.  Patient reports anaphylactic reaction to NSAIDs.  Lidocaine patch was applied to the chest for pain relief.  On reevaluation patient denies any relief of symptoms.  Labs, EKG, and imaging ordered and interpreted. - Labs show no acute abnormalities.  Negative troponin.  BMP and CMP are unremarkable.  Negative urine pregnancy. - EKG shows sinus tachycardia with a rate of 118 bpm. - CXR was unremarkable.  No acute cardiopulmonary processes.  Discussed results with patient.  I told her we will try 650 mg of Acetaminophen prior to discharge.  I will send a prescription into her pharmacy for the medication and advised her to try icing her chest for any relief of symptoms.  Patient is understandable. All questions and concerns were answered. She is stable and safe for discharge home at this time.        Final Clinical Impression(s) / ED Diagnoses Final diagnoses:  Pleuritic chest pain     Rx / DC Orders ED Discharge Orders          Ordered    acetaminophen (TYLENOL) 325 MG tablet  Every 6 hours PRN        04/24/23 2337              Maxwell Marion, PA-C 04/24/23 2346    Pricilla Loveless, MD 04/25/23 2137

## 2023-04-29 ENCOUNTER — Encounter: Payer: Self-pay | Admitting: Family Medicine

## 2023-04-29 NOTE — Progress Notes (Signed)
Patient asked about possible surrogacy today during her child's WCC.  Discussed that while it is an incredible gift to give to another family, pregnancy is not a risk free state. Encouraged her to consider the possible health consequences, particularly given her chronic hypertension and risk of pre-eclampsia, as well as lifetime changes to her body. Also recommended she engage with a lawyer to work out all the details and what ifs, as well as clarifying the financial details so there would be no ambiguity. Finally recommended no pregnancy until she is at least 18 months post partum.

## 2023-06-09 ENCOUNTER — Other Ambulatory Visit: Payer: Self-pay

## 2023-06-09 MED ORDER — FLUCONAZOLE 150 MG PO TABS: 150.0000 mg | ORAL_TABLET | Freq: Once | ORAL | 0 refills | Status: AC

## 2023-06-09 NOTE — Progress Notes (Signed)
Pt called stating having vaginal discharge with white thick discharge that is itchy. Denies odor, abd pain or cramps. Pt advised will send in Rx Diflucan per protocol.  Judeth Cornfield, RNC

## 2023-06-18 ENCOUNTER — Ambulatory Visit (INDEPENDENT_AMBULATORY_CARE_PROVIDER_SITE_OTHER): Payer: Medicaid Other | Admitting: *Deleted

## 2023-06-18 ENCOUNTER — Other Ambulatory Visit (HOSPITAL_COMMUNITY)
Admission: RE | Admit: 2023-06-18 | Discharge: 2023-06-18 | Disposition: A | Discharge status: Home / Self Care | Payer: Medicaid Other | Source: Ambulatory Visit | Attending: Family Medicine | Admitting: Family Medicine

## 2023-06-18 ENCOUNTER — Other Ambulatory Visit: Payer: Self-pay

## 2023-06-18 VITALS — BP 108/74 | HR 70 | Ht 65.0 in | Wt 230.0 lb

## 2023-06-18 DIAGNOSIS — R319 Hematuria, unspecified: Secondary | ICD-10-CM

## 2023-06-18 DIAGNOSIS — N898 Other specified noninflammatory disorders of vagina: Secondary | ICD-10-CM

## 2023-06-18 DIAGNOSIS — R350 Frequency of micturition: Secondary | ICD-10-CM

## 2023-06-18 LAB — POCT URINALYSIS DIP (DEVICE)
Bilirubin Urine: NEGATIVE
Glucose, UA: NEGATIVE mg/dL
Ketones, ur: NEGATIVE mg/dL
Nitrite: NEGATIVE
Protein, ur: NEGATIVE mg/dL
Specific Gravity, Urine: >=1.03 (ref 1.005–1.030)
Urobilinogen, UA: 0.2 mg/dL (ref 0.0–1.0)
pH: 6 (ref 5.0–8.0)

## 2023-06-18 LAB — POCT PREGNANCY, URINE: Preg Test, Ur: NEGATIVE

## 2023-06-18 NOTE — Progress Notes (Addendum)
Patient here for a self swab with c/o white vaginal discharge that is clumpy with itching. Also c/o urinary frequency. UA and self swab collected. Explained she will be contacted if results are postive and she needs treatment. She voices understanding. UA shows blood and leukocytes , she reports her period just started now. Advised ua may show blood and leukocytes due to period starting but since she cleaned first, will send for culture to be sure no UTI. She also requested upt.  Nancy Fetter

## 2023-06-18 NOTE — Addendum Note (Signed)
Addended by: Gerome Apley on: 06/18/2023 11:53 AM   Modules accepted: Orders

## 2023-06-19 LAB — URINE CULTURE: Organism ID, Bacteria: NO GROWTH

## 2023-06-24 LAB — CERVICOVAGINAL ANCILLARY ONLY
Bacterial Vaginitis (gardnerella): NEGATIVE
Candida Glabrata: NEGATIVE
Candida Vaginitis: NEGATIVE
Chlamydia: NEGATIVE
Comment: NEGATIVE
Comment: NEGATIVE
Comment: NEGATIVE
Comment: NEGATIVE
Comment: NEGATIVE
Comment: NORMAL
Neisseria Gonorrhea: NEGATIVE
Trichomonas: NEGATIVE

## 2023-06-30 NOTE — Progress Notes (Signed)
Patient seen and assessed by nursing staff.  Agree with documentation and plan.  

## 2023-12-10 ENCOUNTER — Encounter: Payer: Medicaid Other | Admitting: Family Medicine

## 2023-12-27 ENCOUNTER — Encounter: Payer: Self-pay | Admitting: Family Medicine

## 2023-12-27 ENCOUNTER — Other Ambulatory Visit (HOSPITAL_COMMUNITY)
Admission: RE | Admit: 2023-12-27 | Discharge: 2023-12-27 | Disposition: A | Discharge status: Home / Self Care | Source: Ambulatory Visit | Attending: Family Medicine | Admitting: Family Medicine

## 2023-12-27 ENCOUNTER — Other Ambulatory Visit: Payer: Self-pay

## 2023-12-27 ENCOUNTER — Ambulatory Visit: Payer: Medicaid Other | Admitting: Family Medicine

## 2023-12-27 VITALS — BP 121/83 | HR 105 | Wt 231.2 lb

## 2023-12-27 DIAGNOSIS — Z113 Encounter for screening for infections with a predominantly sexual mode of transmission: Secondary | ICD-10-CM

## 2023-12-27 DIAGNOSIS — Z01419 Encounter for gynecological examination (general) (routine) without abnormal findings: Secondary | ICD-10-CM | POA: Insufficient documentation

## 2023-12-27 DIAGNOSIS — Z1159 Encounter for screening for other viral diseases: Secondary | ICD-10-CM | POA: Diagnosis not present

## 2023-12-27 DIAGNOSIS — Z114 Encounter for screening for human immunodeficiency virus [HIV]: Secondary | ICD-10-CM

## 2023-12-27 NOTE — Progress Notes (Signed)
 ANNUAL EXAM Patient name: Rebecca Navarro MRN [id number]  Date of birth: May 27, 2001 Chief Complaint:   Gynecologic Exam  History of Present Illness:   Rebecca Navarro is a 23 y.o. G41P1001 Caucasian female being seen today for a routine annual exam.  Current complaints: Current concerns.  Reports she is with a new partner and would like STD screening.  Patient's last menstrual period was 12/16/2023 (exact date).   The pregnancy intention screening data noted above was reviewed. Potential methods of contraception were discussed. The patient elected to proceed with No data recorded.   Last pap never done.  Last mammogram: Never done Last colonoscopy: Never done     08/03/2022   12:01 PM 07/24/2022   10:46 AM 07/15/2022   12:30 PM 07/09/2022    9:09 AM 06/02/2022    3:56 PM  Depression screen PHQ 2/9  Decreased Interest 0 1 0 0 2  Down, Depressed, Hopeless 0 0 0 0 0  PHQ - 2 Score 0 1 0 0 2  Altered sleeping 1 0 1 2 2   Tired, decreased energy 1 3 2 2 2   Change in appetite 0 0 0 0 0  Feeling bad or failure about yourself  0 0 0 0 0  Trouble concentrating 0 0 0 1 0  Moving slowly or fidgety/restless 0 2 2 2 1   Suicidal thoughts 0 0 0 0 0  PHQ-9 Score 2 6 5 7 7   Difficult doing work/chores Not difficult at all  Not difficult at all Somewhat difficult         08/03/2022   12:01 PM 07/24/2022   10:47 AM 07/15/2022   12:30 PM 07/09/2022    9:09 AM  GAD 7 : Generalized Anxiety Score  Nervous, Anxious, on Edge 0 0 0 0  Control/stop worrying 0 0 0 0  Worry too much - different things 0 0 0 0  Trouble relaxing 1 3 2 2   Restless 1 3 2 2   Easily annoyed or irritable 1 2 2 2   Afraid - awful might happen 0 0 0 0  Total GAD 7 Score 3 8 6 6   Anxiety Difficulty Not difficult at all        Review of Systems:   Pertinent items are noted in HPI Denies any headaches, blurred vision, fatigue, shortness of breath, chest pain, abdominal pain, abnormal vaginal  discharge/itching/odor/irritation, problems with periods, bowel movements, urination, or intercourse unless otherwise stated above. Pertinent History Reviewed:  Reviewed past medical,surgical, social and family history.  Reviewed problem list, medications and allergies. Physical Assessment:   Vitals:   12/27/23 1025  BP: (!) 141/82  Pulse: (!) 112  Weight: 231 lb 3.2 oz (104.9 kg)  Body mass index is 38.47 kg/m.        Physical Examination:   General appearance - well appearing, and in no distress  Mental status - alert, oriented to person, place, and time  Psych:  She has a normal mood and affect  Skin - warm and dry, normal color, no suspicious lesions noted  Chest - effort normal, all lung fields clear to auscultation bilaterally  Heart - normal rate and regular rhythm  Neck:  midline trachea, no thyromegaly or nodules  Abdomen - soft, nontender, nondistended, no masses or organomegaly  Pelvic - VULVA: normal appearing vulva with no masses, tenderness or lesions  VAGINA: normal appearing vagina with normal color and discharge, no lesions  CERVIX: normal appearing cervix without discharge or lesions, no  CMT  Thin prep pap is done with reflexive HR HPV cotesting  UTERUS: uterus is felt to be normal size, shape, consistency and nontender   ADNEXA: No adnexal masses or tenderness noted.  Extremities:  No swelling or varicosities noted  Chaperone present for exam  No results found for this or any previous visit (from the past 24 hours).  Assessment & Plan:  1) Well-Woman Exam  2) screening for STD Swabs collected today along with blood test.  Labs/procedures today: Pap smear, cervicovaginal ancillary, HIV, hepatitis, RPR  Mammogram: @ 22yo, or sooner if problems Colonoscopy: @ 22yo, or sooner if problems  Orders Placed This Encounter  Procedures   HIV Antibody (routine testing w rflx)   RPR   Hepatitis B Surface AntiGEN   Hepatitis C Antibody    Meds: No orders of the  defined types were placed in this encounter.   Follow-up: No follow-ups on file.  Celedonio Savage, MD 12/27/2023 10:38 AM

## 2023-12-28 LAB — CERVICOVAGINAL ANCILLARY ONLY
Bacterial Vaginitis (gardnerella): POSITIVE — AB
Candida Glabrata: NEGATIVE
Candida Vaginitis: NEGATIVE
Chlamydia: NEGATIVE
Comment: NEGATIVE
Comment: NEGATIVE
Comment: NEGATIVE
Comment: NEGATIVE
Comment: NEGATIVE
Comment: NORMAL
Neisseria Gonorrhea: NEGATIVE
Trichomonas: NEGATIVE

## 2023-12-28 LAB — HEPATITIS B SURFACE ANTIGEN: Hepatitis B Surface Ag: NEGATIVE

## 2023-12-28 LAB — HEPATITIS C ANTIBODY: Hep C Virus Ab: NONREACTIVE

## 2023-12-28 LAB — RPR: RPR Ser Ql: NONREACTIVE

## 2023-12-28 LAB — HIV ANTIBODY (ROUTINE TESTING W REFLEX): HIV Screen 4th Generation wRfx: NONREACTIVE

## 2023-12-29 ENCOUNTER — Encounter: Payer: Self-pay | Admitting: Family Medicine

## 2023-12-29 LAB — CYTOLOGY - PAP: Diagnosis: NEGATIVE

## 2023-12-29 MED ORDER — METRONIDAZOLE 500 MG PO TABS: 500.0000 mg | ORAL_TABLET | Freq: Two times a day (BID) | ORAL | 0 refills | Status: DC

## 2023-12-29 NOTE — Addendum Note (Signed)
 Addended by: Celedonio Savage on: 12/29/2023 10:24 AM   Modules accepted: Orders

## 2024-03-13 ENCOUNTER — Encounter: Payer: Self-pay | Admitting: Family Medicine

## 2024-03-14 ENCOUNTER — Other Ambulatory Visit: Payer: Self-pay | Admitting: Lactation Services

## 2024-03-14 MED ORDER — FLUCONAZOLE 150 MG PO TABS: 150.0000 mg | ORAL_TABLET | Freq: Once | ORAL | 0 refills | Status: AC

## 2024-03-14 NOTE — Progress Notes (Signed)
 Sent Diflucan  to Pharmacy for symptoms of vaginal itching and thick vaginal discharge per standing order.

## 2024-03-19 ENCOUNTER — Emergency Department (HOSPITAL_BASED_OUTPATIENT_CLINIC_OR_DEPARTMENT_OTHER)
Admission: EM | Admit: 2024-03-19 | Discharge: 2024-03-20 | Disposition: A | Discharge status: Home / Self Care | Attending: Emergency Medicine | Admitting: Emergency Medicine

## 2024-03-19 ENCOUNTER — Other Ambulatory Visit: Payer: Self-pay

## 2024-03-19 ENCOUNTER — Encounter (HOSPITAL_BASED_OUTPATIENT_CLINIC_OR_DEPARTMENT_OTHER): Payer: Self-pay | Admitting: Emergency Medicine

## 2024-03-19 DIAGNOSIS — R1110 Vomiting, unspecified: Secondary | ICD-10-CM

## 2024-03-19 DIAGNOSIS — R197 Diarrhea, unspecified: Secondary | ICD-10-CM | POA: Insufficient documentation

## 2024-03-19 DIAGNOSIS — Z8616 Personal history of COVID-19: Secondary | ICD-10-CM | POA: Diagnosis not present

## 2024-03-19 DIAGNOSIS — D72829 Elevated white blood cell count, unspecified: Secondary | ICD-10-CM | POA: Insufficient documentation

## 2024-03-19 DIAGNOSIS — R112 Nausea with vomiting, unspecified: Secondary | ICD-10-CM | POA: Diagnosis present

## 2024-03-19 DIAGNOSIS — J45909 Unspecified asthma, uncomplicated: Secondary | ICD-10-CM | POA: Diagnosis not present

## 2024-03-19 DIAGNOSIS — I10 Essential (primary) hypertension: Secondary | ICD-10-CM | POA: Diagnosis not present

## 2024-03-19 DIAGNOSIS — R1084 Generalized abdominal pain: Secondary | ICD-10-CM | POA: Insufficient documentation

## 2024-03-19 LAB — CBC
HCT: 46.1 % — ABNORMAL HIGH (ref 36.0–46.0)
Hemoglobin: 15.2 g/dL — ABNORMAL HIGH (ref 12.0–15.0)
MCH: 28.6 pg (ref 26.0–34.0)
MCHC: 33 g/dL (ref 30.0–36.0)
MCV: 86.7 fL (ref 80.0–100.0)
Platelets: 353 10*3/uL (ref 150–400)
RBC: 5.32 MIL/uL — ABNORMAL HIGH (ref 3.87–5.11)
RDW: 12.7 % (ref 11.5–15.5)
WBC: 16.3 10*3/uL — ABNORMAL HIGH (ref 4.0–10.5)
nRBC: 0 % (ref 0.0–0.2)

## 2024-03-19 LAB — COMPREHENSIVE METABOLIC PANEL WITH GFR
ALT: 24 U/L (ref 0–44)
AST: 19 U/L (ref 15–41)
Albumin: 4.8 g/dL (ref 3.5–5.0)
Alkaline Phosphatase: 98 U/L (ref 38–126)
Anion gap: 16 — ABNORMAL HIGH (ref 5–15)
BUN: 12 mg/dL (ref 6–20)
CO2: 22 mmol/L (ref 22–32)
Calcium: 10.3 mg/dL (ref 8.9–10.3)
Chloride: 100 mmol/L (ref 98–111)
Creatinine, Ser: 0.94 mg/dL (ref 0.44–1.00)
GFR, Estimated: >60 mL/min (ref >60)
Glucose, Bld: 114 mg/dL — ABNORMAL HIGH (ref 70–99)
Potassium: 3.9 mmol/L (ref 3.5–5.1)
Sodium: 138 mmol/L (ref 135–145)
Total Bilirubin: 0.5 mg/dL (ref 0.0–1.2)
Total Protein: 8.3 g/dL — ABNORMAL HIGH (ref 6.5–8.1)

## 2024-03-19 LAB — LIPASE, BLOOD: Lipase: 18 U/L (ref 11–51)

## 2024-03-19 MED ORDER — SODIUM CHLORIDE 0.9 % IV BOLUS
1000.0000 mL | Freq: Once | INTRAVENOUS | Status: AC
  Administered 2024-03-19: 1000 mL via INTRAVENOUS

## 2024-03-19 MED ORDER — ONDANSETRON HCL 4 MG/2ML IJ SOLN
4.0000 mg | Freq: Once | INTRAMUSCULAR | Status: AC
  Administered 2024-03-19: 4 mg via INTRAVENOUS
  Filled 2024-03-19: qty 2

## 2024-03-19 NOTE — ED Triage Notes (Signed)
 Patient complaining of generalized abdominal pain described as burning that started today. Patient reports nausea, vomiting, and diarrhea. Reports that stools are turning black. Denies blood in vomit. Denies fevers.

## 2024-03-20 LAB — URINALYSIS, ROUTINE W REFLEX MICROSCOPIC
Bilirubin Urine: NEGATIVE
Glucose, UA: NEGATIVE mg/dL
Hgb urine dipstick: NEGATIVE
Ketones, ur: NEGATIVE mg/dL
Nitrite: NEGATIVE
Protein, ur: 30 mg/dL — AB
Specific Gravity, Urine: 1.036 — ABNORMAL HIGH (ref 1.005–1.030)
pH: 6 (ref 5.0–8.0)

## 2024-03-20 LAB — PREGNANCY, URINE: Preg Test, Ur: NEGATIVE

## 2024-03-20 MED ORDER — ONDANSETRON 4 MG PO TBDP: 4.0000 mg | ORAL_TABLET | Freq: Three times a day (TID) | ORAL | 0 refills | Status: DC | PRN

## 2024-03-20 NOTE — ED Provider Notes (Signed)
 [place] EMERGENCY DEPARTMENT AT [facility] Provider Note   CSN: [id number] Arrival date & time: 03/19/24  2240     History  Chief Complaint  Patient presents with   Abdominal Pain    Rebecca Navarro is a 23 y.o. female.  HPI     This is a 23 year old female who presents with nausea, vomiting, diarrhea.  She reports 1 day history of nausea, vomiting, diarrhea.  She thinks she may have gotten food poisoning.  She states that later in the day she developed black stools but did take a dose of Pepto-Bismol for her symptoms.  Reports diffuse crampy abdominal pain.  No urinary symptoms or fevers.  Home Medications Prior to Admission medications   Medication Sig Start Date End Date Taking? Authorizing Provider  ondansetron  (ZOFRAN -ODT) 4 MG disintegrating tablet Take 1 tablet (4 mg total) by mouth every 8 (eight) hours as needed. 03/20/24  Yes Georgana Romain, Vonzella Guernsey, MD  dicloxacillin  (DYNAPEN ) 500 MG capsule Take 1 capsule (500 mg total) by mouth 4 (four) times daily. Patient not taking: Reported on 12/27/2023 09/18/22   Teena Feast, MD  metroNIDAZOLE  (FLAGYL ) 500 MG tablet Take 1 tablet (500 mg total) by mouth 2 (two) times daily. 12/29/23   Cresenzo, John V, MD  nystatin  cream (MYCOSTATIN ) Apply 1 Application topically 2 (two) times daily. Patient not taking: Reported on 12/27/2023 09/01/22   Teena Feast, MD  prenatal vitamin w/FE, FA (PRENATAL 1 + 1) 27-1 MG TABS tablet Take 1 tablet by mouth daily at 12 noon. Patient not taking: Reported on 12/27/2023 12/25/21   Teena Feast, MD      Allergies    Bee venom, Ibuprofen , Trichophyton, Diclofenac, and Nsaids    Review of Systems   Review of Systems  Constitutional:  Negative for fever.  Respiratory:  Negative for shortness of breath.   Cardiovascular:  Negative for chest pain.  Gastrointestinal:  Positive for abdominal pain, diarrhea, nausea and vomiting.  All other systems reviewed and are  negative.   Physical Exam Updated Vital Signs BP 104/62   Pulse 89   Temp 97.8 F (36.6 C)   Resp 20   Ht 1.651 m (5\' 5" )   Wt 104.3 kg   LMP 03/06/2024 (Approximate)   SpO2 98%   BMI 38.27 kg/m  Physical Exam Vitals and nursing note reviewed.  Constitutional:      Appearance: She is well-developed. She is obese. She is not ill-appearing.  HENT:     Head: Normocephalic and atraumatic.  Eyes:     Pupils: Pupils are equal, round, and reactive to light.  Cardiovascular:     Rate and Rhythm: Normal rate and regular rhythm.     Heart sounds: Normal heart sounds.  Pulmonary:     Effort: Pulmonary effort is normal. No respiratory distress.     Breath sounds: No wheezing.  Abdominal:     General: Bowel sounds are normal.     Palpations: Abdomen is soft.     Tenderness: There is generalized abdominal tenderness. There is no guarding or rebound. Negative signs include Rovsing's sign.  Musculoskeletal:     Cervical back: Neck supple.  Skin:    General: Skin is warm and dry.  Neurological:     Mental Status: She is alert and oriented to person, place, and time.     ED Results / Procedures / Treatments   Labs (all labs ordered are listed, but only abnormal results are displayed) Labs Reviewed  COMPREHENSIVE METABOLIC PANEL WITH GFR - Abnormal; Notable for the following components:      Result Value   Glucose, Bld 114 (*)    Total Protein 8.3 (*)    Anion gap 16 (*)    All other components within normal limits  CBC - Abnormal; Notable for the following components:   WBC 16.3 (*)    RBC 5.32 (*)    Hemoglobin 15.2 (*)    HCT 46.1 (*)    All other components within normal limits  URINALYSIS, ROUTINE W REFLEX MICROSCOPIC - Abnormal; Notable for the following components:   APPearance HAZY (*)    Specific Gravity, Urine 1.036 (*)    Protein, ur 30 (*)    Leukocytes,Ua MODERATE (*)    Bacteria, UA RARE (*)    All other components within normal limits  LIPASE, BLOOD   PREGNANCY, URINE    EKG None  Radiology No results found.  Procedures Procedures    Medications Ordered in ED Medications  sodium chloride  0.9 % bolus 1,000 mL (0 mLs Intravenous Stopped 03/20/24 0045)  ondansetron  (ZOFRAN ) injection 4 mg (4 mg Intravenous Given 03/19/24 2337)    ED Course/ Medical Decision Making/ A&P                                 Medical Decision Making Amount and/or Complexity of Data Reviewed Labs: ordered.  Risk Prescription drug management.   This patient presents to the ED for concern of nausea, vomiting, diarrhea, this involves an extensive number of treatment options, and is a complaint that carries with it a high risk of complications and morbidity.  I considered the following differential and admission for this acute, potentially life threatening condition.  The differential diagnosis includes gastritis, gastroenteritis, food poisoning, pancreatitis, cholecystitis, appendicitis  MDM:    This is a 23 year old female who presents with vomiting and diarrhea.  She is nontoxic and vital signs are reassuring.  Initially tachycardic but this improved with fluids.  Her physical exam is nonspecific.  No localized tenderness.  She has some diffuse tenderness but no signs of peritonitis.  Given symptoms, would favor viral etiology or foodborne pathology.  Labs obtained and reviewed.  She does have a leukocytosis but overall appears hemoconcentrated on her CBC.  Patient was given fluids and Zofran  with improvement of her symptoms.  Given that her abdominal exam does not localize and she has had improvement, would have lower suspicion at this time for appendicitis or other intra-abdominal pathology.  Discussed this with the patient and her mother.  Recommend supportive measures.  She was given return precautions.  (Labs, imaging, consults)  Labs: I Ordered, and personally interpreted labs.  The pertinent results include: CBC, CMP, lipase, urinalysis  Imaging  Studies ordered: I ordered imaging studies including none I independently visualized and interpreted imaging. I agree with the radiologist interpretation  Additional history obtained from chart review.  External records from outside source obtained and reviewed including prior evaluations  Cardiac Monitoring: The patient was maintained on a cardiac monitor.  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    Dispense:  20 tablet    Refill:  0    I have reviewed the patients home medicines and have made adjustments as needed  Problem List / ED Course: Problem List Items Addressed This Visit   None Visit Diagnoses       Vomiting and diarrhea    -  Primary                   Final Clinical Impression(s) / ED Diagnoses Final diagnoses:  Vomiting and diarrhea    Rx / DC Orders ED Discharge Orders          Ordered    ondansetron  (ZOFRAN -ODT) 4 MG disintegrating tablet  Every 8 hours PRN        03/20/24 0215              Rory Collard, MD 03/20/24 [id number]

## 2024-03-20 NOTE — Discharge Instructions (Signed)
 You were seen today for nausea, vomiting, and diarrhea.  You appear dehydrated on your workup.  Your white count is slightly elevated; however this can sometimes be related to acute illness or dehydration.  Take Zofran  as needed.  Make sure that you are drinking plenty of fluids.  If your symptoms worsen, you cannot tolerate fluids, or you develop pain specifically in the right lower quadrant, you may need to be reevaluated.

## 2024-03-23 ENCOUNTER — Ambulatory Visit

## 2024-03-23 ENCOUNTER — Other Ambulatory Visit (HOSPITAL_COMMUNITY)
Admission: RE | Admit: 2024-03-23 | Discharge: 2024-03-23 | Disposition: A | Discharge status: Home / Self Care | Source: Ambulatory Visit | Attending: Family Medicine | Admitting: Family Medicine

## 2024-03-23 VITALS — BP 113/79 | HR 75 | Ht 65.0 in | Wt 238.0 lb

## 2024-03-23 DIAGNOSIS — N898 Other specified noninflammatory disorders of vagina: Secondary | ICD-10-CM

## 2024-03-23 NOTE — Progress Notes (Signed)
 Pt presents for self swab due to yellow-green vaginal discharge and foul odor. Pt instructed on how to perform self-swab and voiced understanding. Pt informed that her results would be available in her MyChart account and she would be contacted with anything abnormal. Pt voiced understanding and denies any further questions or concerns.   Arnell Lange, RN

## 2024-03-24 ENCOUNTER — Ambulatory Visit: Payer: Self-pay | Admitting: Obstetrics and Gynecology

## 2024-03-24 LAB — CERVICOVAGINAL ANCILLARY ONLY
Bacterial Vaginitis (gardnerella): POSITIVE — AB
Candida Glabrata: NEGATIVE
Candida Vaginitis: NEGATIVE
Chlamydia: NEGATIVE
Comment: NEGATIVE
Comment: NEGATIVE
Comment: NEGATIVE
Comment: NEGATIVE
Comment: NEGATIVE
Comment: NORMAL
Neisseria Gonorrhea: NEGATIVE
Trichomonas: NEGATIVE

## 2024-03-24 MED ORDER — METRONIDAZOLE 500 MG PO TABS: 500.0000 mg | ORAL_TABLET | Freq: Two times a day (BID) | ORAL | 0 refills | Status: AC

## 2024-05-15 ENCOUNTER — Encounter: Payer: Self-pay | Admitting: Family Medicine

## 2024-05-15 ENCOUNTER — Other Ambulatory Visit: Payer: Self-pay

## 2024-05-15 MED ORDER — METRONIDAZOLE 500 MG PO TABS: 500.0000 mg | ORAL_TABLET | Freq: Two times a day (BID) | ORAL | 0 refills | Status: DC

## 2024-05-15 MED ORDER — METRONIDAZOLE 1 % EX GEL: Freq: Every day | CUTANEOUS | 0 refills | Status: DC

## 2024-10-31 ENCOUNTER — Encounter: Payer: Self-pay | Admitting: Family Medicine

## 2024-11-08 ENCOUNTER — Ambulatory Visit: Payer: Self-pay | Admitting: General Practice

## 2024-11-08 ENCOUNTER — Other Ambulatory Visit: Payer: Self-pay

## 2024-11-08 ENCOUNTER — Other Ambulatory Visit (HOSPITAL_COMMUNITY)
Admission: RE | Admit: 2024-11-08 | Discharge: 2024-11-08 | Disposition: A | Discharge status: Home / Self Care | Source: Ambulatory Visit | Attending: Family Medicine | Admitting: Family Medicine

## 2024-11-08 VITALS — BP 130/75 | HR 76 | Ht 65.0 in | Wt 248.0 lb

## 2024-11-08 DIAGNOSIS — N898 Other specified noninflammatory disorders of vagina: Secondary | ICD-10-CM

## 2024-11-08 NOTE — Progress Notes (Signed)
 Patient presents to office today reporting vaginal odor & itching x 5 days. No abnormal discharge. 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 Not currently sexually active. She also states she was recently diagnosed with PCOS by her family doctor & was prescribed Metformin. Will schedule follow up with Dr Lola. Patient was instructed in self swab and specimen collected. Advised results will be back in 24-48 hours and available via mychart.   Elenor DEL RN BSN 11/08/24

## 2024-11-09 ENCOUNTER — Ambulatory Visit: Payer: Self-pay | Admitting: Family Medicine

## 2024-11-09 LAB — CERVICOVAGINAL ANCILLARY ONLY
Bacterial Vaginitis (gardnerella): POSITIVE — AB
Candida Glabrata: NEGATIVE
Candida Vaginitis: NEGATIVE
Chlamydia: NEGATIVE
Comment: NEGATIVE
Comment: NEGATIVE
Comment: NEGATIVE
Comment: NEGATIVE
Comment: NEGATIVE
Comment: NORMAL
Neisseria Gonorrhea: NEGATIVE
Trichomonas: NEGATIVE

## 2024-11-09 MED ORDER — METRONIDAZOLE 500 MG PO TABS: 500.0000 mg | ORAL_TABLET | Freq: Two times a day (BID) | ORAL | 0 refills | Status: DC

## 2024-11-24 ENCOUNTER — Ambulatory Visit: Admitting: Family Medicine

## 2024-11-24 ENCOUNTER — Other Ambulatory Visit: Payer: Self-pay

## 2024-11-24 ENCOUNTER — Encounter: Payer: Self-pay | Admitting: Family Medicine

## 2024-11-24 VITALS — BP 125/72 | HR 73 | Wt 248.6 lb

## 2024-11-24 DIAGNOSIS — B9689 Other specified bacterial agents as the cause of diseases classified elsewhere: Secondary | ICD-10-CM | POA: Insufficient documentation

## 2024-11-24 DIAGNOSIS — E282 Polycystic ovarian syndrome: Secondary | ICD-10-CM | POA: Insufficient documentation

## 2024-11-24 DIAGNOSIS — O10911 Unspecified pre-existing hypertension complicating pregnancy, first trimester: Secondary | ICD-10-CM

## 2024-11-24 DIAGNOSIS — E66813 Obesity, class 3: Secondary | ICD-10-CM

## 2024-11-24 MED ORDER — SEMAGLUTIDE-WEIGHT MANAGEMENT 1 MG/0.5ML ~~LOC~~ SOAJ: 1.0000 mg | SUBCUTANEOUS | 0 refills | Status: AC

## 2024-11-24 MED ORDER — SEMAGLUTIDE-WEIGHT MANAGEMENT 1.7 MG/0.75ML ~~LOC~~ SOAJ: 1.7000 mg | SUBCUTANEOUS | 0 refills | Status: AC

## 2024-11-24 MED ORDER — SEMAGLUTIDE-WEIGHT MANAGEMENT 0.5 MG/0.5ML ~~LOC~~ SOAJ: 0.5000 mg | SUBCUTANEOUS | 0 refills | Status: AC

## 2024-11-24 MED ORDER — CLINDAMYCIN PHOSPHATE 2 % VA CREA: 1.0000 | TOPICAL_CREAM | VAGINAL | 5 refills | Status: AC

## 2024-11-24 MED ORDER — SEMAGLUTIDE-WEIGHT MANAGEMENT 0.25 MG/0.5ML ~~LOC~~ SOAJ: 0.2500 mg | SUBCUTANEOUS | 0 refills | Status: AC

## 2024-11-24 MED ORDER — SEMAGLUTIDE-WEIGHT MANAGEMENT 2.4 MG/0.75ML ~~LOC~~ SOAJ: 2.4000 mg | SUBCUTANEOUS | 0 refills | Status: AC

## 2024-11-24 MED ORDER — NORETHINDRONE 0.35 MG PO TABS: 1.0000 | ORAL_TABLET | Freq: Every day | ORAL | 11 refills | Status: AC

## 2024-11-24 NOTE — Progress Notes (Signed)
 "  [facility] OFFICE VISIT NOTE  History:   Rebecca Navarro is a 24 y.o. G1P1001 here today for multiple issues.  #Recurrent BV Has had 3 infections in the past year, last one last month Has used flagyl  pills for all of these, clear up but always come back Tried boric acid but was very irritating Currently no longer has fishy odor but still having some yellowish discharge  #PCOS Diagnosed by PCP, started on metformin mid December (a month and a half ago) Wondering if she needs to do anything else or have further follow up Reports she is having unwanted chin hairs, irregular and very heavy periods, and is struggling to lose weight They had discussed starting Wegovy  but PCP office has not been able to get it approved   Health Maintenance Due  Topic Date Due   HPV VACCINES (3 - 2-dose series) 03/20/2014   Meningococcal B Vaccine (1 of 2 - Standard) Never done   Pneumococcal Vaccine (1 of 2 - PCV) 07/26/2021   Influenza Vaccine  05/19/2024    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   Health Maintenance:   Last pap: Lab Results  Component Value Date   DIAGPAP  12/27/2023    - Negative for intraepithelial lesion or malignancy (NILM)    Last mammogram:  N/a   Review of Systems:  Pertinent items noted in HPI and remainder of comprehensive ROS otherwise negative.  Physical Exam:  BP 125/72   Pulse 73   Wt 248 lb 9.6 oz (112.8 kg)   LMP 11/04/2024 (Exact Date)   BMI 41.37 kg/m    Physical Exam Constitutional:      General: She is not in acute distress.    Appearance: Normal appearance. She is not ill-appearing.  HENT:     Head: Atraumatic.  Eyes:     General: No scleral icterus.    Conjunctiva/sclera: Conjunctivae normal.  Pulmonary:     Effort: Pulmonary effort is normal.  Skin:    General: Skin is warm and dry.     Coloration: Skin is not jaundiced or pale.  Neurological:     Mental Status: She is alert.     Coordination: Coordination normal.  Psychiatric:        Mood and Affect: Mood normal.        Behavior: Behavior normal.     Labs and Imaging No results found for this or any previous visit (from the past week). No results found.    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 Discussed weight loss is mainstay of treatment but very difficult, metformin acceptable as well but I agree with GLP1 treatment. Will get ROI to look at labs that outside clinic obtained as well. Will also send Wegovy  Rx and see if we are  more successful. Also discussed starting micronor  for endometrial protection (will avoid COC's given BMI, cHTN, etc.) and she is amenable. Discussed hopefully if Wegovy  is effective and symptoms of PCOS resolve then she won't need it long term in the future.       Relevant Medications   norethindrone  (MICRONOR ) 0.35 MG tablet   semaglutide -weight management (WEGOVY ) 0.25 MG/0.5ML SOAJ SQ injection   semaglutide -weight management (WEGOVY ) 0.5 MG/0.5ML SOAJ SQ injection (Start on 12/23/2024)   semaglutide -weight management (WEGOVY ) 1 MG/0.5ML SOAJ SQ injection (Start on 01/21/2025)   semaglutide -weight management (WEGOVY ) 1.7 MG/0.75ML SOAJ SQ injection (Start on 02/19/2025)   semaglutide -weight management (WEGOVY ) 2.4 MG/0.75ML SOAJ SQ injection  (Start on 03/20/2025)     Genitourinary   BV (bacterial vaginosis) - Primary   Documented as recurrent. Suspect discharge may just be physiologic given lack of odor. Long stretches between episodes though, discussed it is borderline whether suppressive therapy would be worthwhile but she would prefer to proceed with it. Rx sent for clindamycin  to be used twice weekly for 3 months, will see her back after that to check in.       Relevant Medications   clindamycin  (CLEOCIN ) 2 % vaginal cream (Start on 11/27/2024)   Other Visit Diagnoses       Class 3 severe obesity with serious comorbidity and body mass index (BMI) of 40.0 to 44.9 in adult, unspecified obesity type (HCC)       Relevant Medications   semaglutide -weight management (WEGOVY ) 0.25 MG/0.5ML SOAJ SQ injection   semaglutide -weight management (WEGOVY ) 0.5 MG/0.5ML SOAJ SQ injection (Start on 12/23/2024)   semaglutide -weight management (WEGOVY ) 1 MG/0.5ML SOAJ SQ injection (Start on 01/21/2025)   semaglutide -weight management (WEGOVY ) 1.7 MG/0.75ML SOAJ SQ injection (Start on 02/19/2025)   semaglutide -weight management (WEGOVY ) 2.4 MG/0.75ML SOAJ SQ injection (Start on 03/20/2025)       Routine preventative health maintenance measures emphasized. Please refer to After Visit Summary for other counseling recommendations.   Return in about 3 months (around 02/21/2025) for [facility] patient, f/u .    Total face-to-face time with patient: 25 minutes.  Over 50% of encounter was spent on counseling and coordination of care.   Donnice CHRISTELLA Carolus, MD/MPH Attending Family Medicine Physician, [facility], [facility] "

## 2024-11-24 NOTE — Assessment & Plan Note (Addendum)
 Documented as recurrent. Suspect discharge may just be physiologic given lack of odor. Long stretches between episodes though, discussed it is borderline whether suppressive therapy would be worthwhile but she would prefer to proceed with it. Rx sent for clindamycin  to be used twice weekly for 3 months, will see her back after that to check in.

## 2024-11-24 NOTE — Assessment & Plan Note (Addendum)
 Meets clinical criteria. Discussed weight loss is mainstay of treatment but very difficult, metformin acceptable as well but I agree with GLP1 treatment. Will get ROI to look at labs that outside clinic obtained as well. Will also send Wegovy  Rx and see if we are more successful. Also discussed starting micronor  for endometrial protection (will avoid COC's given BMI, cHTN, etc.) and she is amenable. Discussed hopefully if Wegovy  is effective and symptoms of PCOS resolve then she won't need it long term in the future.

## 2025-02-23 ENCOUNTER — Encounter
